# Patient Record
Sex: Female | Born: 1950 | Race: White | Hispanic: No | Marital: Married | Smoking: Never smoker
Health system: Southern US, Community
[De-identification: ages and names within clinical notes are randomized; demographics above are authoritative.]

## PROBLEM LIST (undated history)

## (undated) DIAGNOSIS — R011 Cardiac murmur, unspecified: Secondary | ICD-10-CM

## (undated) DIAGNOSIS — M810 Age-related osteoporosis without current pathological fracture: Secondary | ICD-10-CM

## (undated) DIAGNOSIS — Z9089 Acquired absence of other organs: Secondary | ICD-10-CM

## (undated) HISTORY — DX: Cardiac murmur, unspecified: R01.1

## (undated) HISTORY — PX: ECTOPIC PREGNANCY SURGERY: SHX613

## (undated) HISTORY — DX: Age-related osteoporosis without current pathological fracture: M81.0

## (undated) HISTORY — PX: TONSILLECTOMY: SUR1361

## (undated) HISTORY — DX: Acquired absence of other organs: Z90.89

---

## 1982-06-01 DIAGNOSIS — O00109 Unspecified tubal pregnancy without intrauterine pregnancy: Secondary | ICD-10-CM

## 1982-06-01 DIAGNOSIS — Z98891 History of uterine scar from previous surgery: Secondary | ICD-10-CM

## 1982-06-01 HISTORY — DX: History of uterine scar from previous surgery: Z98.891

## 1982-06-01 HISTORY — DX: Unspecified tubal pregnancy without intrauterine pregnancy: O00.109

## 1989-06-01 DIAGNOSIS — Z98891 History of uterine scar from previous surgery: Secondary | ICD-10-CM

## 1989-06-01 HISTORY — DX: History of uterine scar from previous surgery: Z98.891

## 1997-06-01 DIAGNOSIS — Z905 Acquired absence of kidney: Secondary | ICD-10-CM

## 1997-06-01 HISTORY — DX: Acquired absence of kidney: Z90.5

## 1999-06-02 HISTORY — PX: KIDNEY DONATION: SHX685

## 2013-10-27 ENCOUNTER — Emergency Department (HOSPITAL_COMMUNITY): Payer: Self-pay

## 2013-10-27 ENCOUNTER — Emergency Department (HOSPITAL_COMMUNITY)
Admission: EM | Admit: 2013-10-27 | Discharge: 2013-10-27 | Disposition: A | Discharge status: Home / Self Care | Payer: Self-pay | Attending: Emergency Medicine | Admitting: Emergency Medicine

## 2013-10-27 ENCOUNTER — Encounter (HOSPITAL_COMMUNITY): Payer: Self-pay | Admitting: Emergency Medicine

## 2013-10-27 DIAGNOSIS — Z79899 Other long term (current) drug therapy: Secondary | ICD-10-CM | POA: Insufficient documentation

## 2013-10-27 DIAGNOSIS — R0602 Shortness of breath: Secondary | ICD-10-CM | POA: Insufficient documentation

## 2013-10-27 DIAGNOSIS — R072 Precordial pain: Secondary | ICD-10-CM | POA: Insufficient documentation

## 2013-10-27 DIAGNOSIS — R110 Nausea: Secondary | ICD-10-CM | POA: Insufficient documentation

## 2013-10-27 DIAGNOSIS — Z880 Allergy status to penicillin: Secondary | ICD-10-CM | POA: Insufficient documentation

## 2013-10-27 DIAGNOSIS — R61 Generalized hyperhidrosis: Secondary | ICD-10-CM | POA: Insufficient documentation

## 2013-10-27 DIAGNOSIS — R079 Chest pain, unspecified: Secondary | ICD-10-CM

## 2013-10-27 LAB — I-STAT TROPONIN, ED: Troponin i, poc: 0.01 ng/mL (ref 0.00–0.08)

## 2013-10-27 LAB — BASIC METABOLIC PANEL
BUN: 22 mg/dL (ref 6–23)
CALCIUM: 8.8 mg/dL (ref 8.4–10.5)
CO2: 23 mEq/L (ref 19–32)
CREATININE: 1.03 mg/dL (ref 0.50–1.10)
Chloride: 109 mEq/L (ref 96–112)
GFR, EST AFRICAN AMERICAN: 67 mL/min — AB (ref >90)
GFR, EST NON AFRICAN AMERICAN: 57 mL/min — AB (ref >90)
Glucose, Bld: 107 mg/dL — ABNORMAL HIGH (ref 70–99)
Potassium: 3.7 mEq/L (ref 3.7–5.3)
Sodium: 144 mEq/L (ref 137–147)

## 2013-10-27 LAB — CBC
HCT: 36.2 % (ref 36.0–46.0)
Hemoglobin: 12.3 g/dL (ref 12.0–15.0)
MCH: 31.3 pg (ref 26.0–34.0)
MCHC: 34 g/dL (ref 30.0–36.0)
MCV: 92.1 fL (ref 78.0–100.0)
PLATELETS: 215 10*3/uL (ref 150–400)
RBC: 3.93 MIL/uL (ref 3.87–5.11)
RDW: 13.1 % (ref 11.5–15.5)
WBC: 6.8 10*3/uL (ref 4.0–10.5)

## 2013-10-27 LAB — TROPONIN I: Troponin I: <0.3 ng/mL (ref <0.30)

## 2013-10-27 MED ORDER — ASPIRIN EC 325 MG PO TBEC: 325.0000 mg | DELAYED_RELEASE_TABLET | Freq: Every day | ORAL | Status: DC

## 2013-10-27 NOTE — Discharge Instructions (Signed)
We saw you in the ER for the chest pain/shortness of breath. °All of our cardiac workup is normal, including labs, EKG and chest X-RAY are normal. °We are not sure what is causing your discomfort, but we feel comfortable sending you home at this time. The workup in the ER is not complete, and you should follow up with your primary care doctor for further evaluation. ° ° °Chest Pain (Nonspecific) °It is often hard to give a specific diagnosis for the cause of chest pain. There is always a chance that your pain could be related to something serious, such as a heart attack or a blood clot in the lungs. You need to follow up with your caregiver for further evaluation. °CAUSES  °· Heartburn. °· Pneumonia or bronchitis. °· Anxiety or stress. °· Inflammation around your heart (pericarditis) or lung (pleuritis or pleurisy). °· A blood clot in the lung. °· A collapsed lung (pneumothorax). It can develop suddenly on its own (spontaneous pneumothorax) or from injury (trauma) to the chest. °· Shingles infection (herpes zoster virus). °The chest wall is composed of bones, muscles, and cartilage. Any of these can be the source of the pain. °· The bones can be bruised by injury. °· The muscles or cartilage can be strained by coughing or overwork. °· The cartilage can be affected by inflammation and become sore (costochondritis). °DIAGNOSIS  °Lab tests or other studies, such as X-rays, electrocardiography, stress testing, or cardiac imaging, may be needed to find the cause of your pain.  °TREATMENT  °· Treatment depends on what may be causing your chest pain. Treatment may include: °· Acid blockers for heartburn. °· Anti-inflammatory medicine. °· Pain medicine for inflammatory conditions. °· Antibiotics if an infection is present. °· You may be advised to change lifestyle habits. This includes stopping smoking and avoiding alcohol, caffeine, and chocolate. °· You may be advised to keep your head raised (elevated) when sleeping.  This reduces the chance of acid going backward from your stomach into your esophagus. °· Most of the time, nonspecific chest pain will improve within 2 to 3 days with rest and mild pain medicine. °HOME CARE INSTRUCTIONS  °· If antibiotics were prescribed, take your antibiotics as directed. Finish them even if you start to feel better. °· For the next few days, avoid physical activities that bring on chest pain. Continue physical activities as directed. °· Do not smoke. °· Avoid drinking alcohol. °· Only take over-the-counter or prescription medicine for pain, discomfort, or fever as directed by your caregiver. °· Follow your caregiver's suggestions for further testing if your chest pain does not go away. °· Keep any follow-up appointments you made. If you do not go to an appointment, you could develop lasting (chronic) problems with pain. If there is any problem keeping an appointment, you must call to reschedule. °SEEK MEDICAL CARE IF:  °· You think you are having problems from the medicine you are taking. Read your medicine instructions carefully. °· Your chest pain does not go away, even after treatment. °· You develop a rash with blisters on your chest. °SEEK IMMEDIATE MEDICAL CARE IF:  °· You have increased chest pain or pain that spreads to your arm, neck, jaw, back, or abdomen. °· You develop shortness of breath, an increasing cough, or you are coughing up blood. °· You have severe back or abdominal pain, feel nauseous, or vomit. °· You develop severe weakness, fainting, or chills. °· You have a fever. °THIS IS AN EMERGENCY. Do not wait to   see if the pain will go away. Get medical help at once. Call your local emergency services (911 in [place] Do not drive yourself to the hospital. MAKE SURE YOU:   Understand these instructions.  Will watch your condition.  Will get help right away if you are not doing well or get worse. Document Released: 02/25/2005 Document Revised: 08/10/2011 Document Reviewed:  12/22/2007 [facility] Patient Information 2014 [place].  Aspirin and Your Heart Aspirin affects the way your blood clots and helps "thin" the blood. Aspirin has many uses in heart disease. It may be used as a primary prevention to help reduce the risk of heart related events. It also can be used as a secondary measure to prevent more heart attacks or to prevent additional damage from blood clots.  ASPIRIN MAY HELP IF YOU:  Have had a heart attack or chest pain.  Have undergone open heart surgery such as CABG (Coronary Artery Bypass Surgery).  Have had coronary angioplasty with or without stents.  Have experienced a stroke or TIA (transient ischemic attack).  Have peripheral vascular disease (PAD).  Have chronic heart rhythm problems such as atrial fibrillation.  Are at risk for heart disease. BEFORE STARTING ASPIRIN Before you start taking aspirin, your caregiver will need to review your medical history. Many things will need to be taken into consideration, such as:  Smoking status.  Blood pressure.  Diabetes.  Gender.  Weight.  Cholesterol level. ASPIRIN DOSES  Aspirin should only be taken on the advice of your caregiver. Talk to your caregiver about how much aspirin you should take. Aspirin comes in different doses such as:  81 mg.  162 mg.  325 mg.  The aspirin dose you take may be affected by many factors, some of which include:  Your current medications, especially if your are taking blood-thinners or anti-platelet medicine.  Liver function.  Heart disease risk.  Age.  Aspirin comes in two forms:  Non-enteric-coated. This type of aspirin does not have a coating and is absorbed faster. Non-enteric coated aspirin is recommended for patients experiencing chest pain symptoms. This type of aspirin also comes in a chewable form.  Enteric-coated. This means the aspirin has a special coating that releases the medicine very slowly. Enteric-coated aspirin  causes less stomach upset. This type of aspirin should not be chewed or crushed. ASPIRIN SIDE EFFECTS Daily use of aspirin can increase your risk of serious side effects. Some of these include:  Increased bleeding. This can range from a cut that does not stop bleeding to more serious problems such as stomach bleeding or bleeding into the brain (Intracerebral bleeding).  Increased bruising.  Stomach upset.  An allergic reaction such as red, itchy skin.  Increased risk of bleeding when combined with non-steroidal anti-inflammatory medicine (NSAIDS).  Alcohol should be drank in moderation when taking aspirin. Alcohol can increase the risk of stomach bleeding when taken with aspirin.  Aspirin should not be given to children less than 95 years of age due to the association of Reye syndrome. Reye syndrome is a serious illness that can affect the brain and liver. Studies have linked Reye syndrome with aspirin use in children.  People that have nasal polyps have an increased risk of developing an aspirin allergy. SEEK MEDICAL CARE IF:   You develop an allergic reaction such as:  Hives.  Itchy skin.  Swelling of the lips, tongue or face.  You develop stomach pain.  You have unusual bleeding or bruising.  You have ringing in  your ears. SEEK IMMEDIATE MEDICAL CARE IF:   You have severe chest pain, especially if the pain is crushing or pressure-like and spreads to the arms, back, neck, or jaw. THIS IS AN EMERGENCY. Do not wait to see if the pain will go away. Get medical help at once. Call your local emergency services (911 in the [place] DO NOT drive yourself to the hospital.  You have stroke-like symptoms such as:  Loss of vision.  Difficulty talking.  Numbness or weakness on one side of your body.  Numbness or weakness in your arm or leg.  Not thinking clearly or feeling confused.  Your bowel movements are bloody, dark red or black in color.  You vomit or cough up  blood.  You have blood in your urine.  You have shortness of breath, coughing or wheezing. MAKE SURE YOU:   Understand these instructions.  Will monitor your condition.  Seek immediate medical care if necessary. Document Released: 04/30/2008 Document Revised: 09/12/2012 Document Reviewed: 04/30/2008 [facility] Patient Information 2014 [place].

## 2013-10-27 NOTE — ED Notes (Signed)
Pt states that she started having substernal chest pain radiating to her left arm, with SOB and diaphoresis onset 2330. Pt states she took 324 ASA at home. Pt received 2 NTG en route with complete relief of chest pain.

## 2013-10-27 NOTE — ED Provider Notes (Addendum)
CSN: [id number]     Arrival date & time 10/27/13  0127 History   First MD Initiated Contact with Patient 10/27/13 0344     Chief Complaint  Patient presents with  . Chest Pain     (Consider location/radiation/quality/duration/timing/severity/associated sxs/prior Treatment) HPI Comments: Pt is a 63 y/o female who comes in to the ER with cc of chest pain. Pt has no medical hx, + fam hx of CAD - brother in his 52s. Pt states that earlier in the night she started having left arm pain, with tingling. Overtime, she started having chest pain. Chest pain was mid sternal and pressure like. She also had associated nausea, dib, and diaphoresis. Pt got nitro x 1 - and she felt better, and after nitro x 2 - she has been pain free. No hx of chest pain, no hx of PE, DVT and no risk factors for the same. Pt also denies any GERD hx., PUD hx.   Patient is a 63 y.o. female presenting with chest pain. The history is provided by the patient.  Chest Pain Associated symptoms: diaphoresis, nausea and shortness of breath   Associated symptoms: no abdominal pain, no headache and not vomiting     History reviewed. No pertinent past medical history. Past Surgical History  Procedure Laterality Date  . Tonsillectomy    . Kidney donation Left 2001  . Cesarean section      X 2  . Ectopic pregnancy surgery     History reviewed. No pertinent family history. History  Substance Use Topics  . Smoking status: Never Smoker   . Smokeless tobacco: Not on file  . Alcohol Use: No   OB History   Grav Para Term Preterm Abortions TAB SAB Ect Mult Living                 Review of Systems  Constitutional: Positive for diaphoresis and activity change.  Respiratory: Positive for shortness of breath.   Cardiovascular: Positive for chest pain.  Gastrointestinal: Positive for nausea. Negative for vomiting and abdominal pain.  Genitourinary: Negative for dysuria.  Musculoskeletal: Negative for neck pain.  Neurological:  Negative for headaches.  All other systems reviewed and are negative.     Allergies  Penicillins  Home Medications   Prior to Admission medications   Medication Sig Start Date End Date Taking? Authorizing Provider  CALCIUM PO Take 1 tablet by mouth daily.   Yes Historical Provider, MD  Multiple Vitamin (MULTI-VITAMIN PO) Take 1 tablet by mouth daily.   Yes Historical Provider, MD   BP 122/68  Pulse 71  Temp(Src) 98.2 F (36.8 C) (Oral)  Resp 13  Ht 5\' 5"  (1.651 m)  Wt 150 lb (68.04 kg)  BMI 24.96 kg/m2  SpO2 100% Physical Exam  Nursing note and vitals reviewed. Constitutional: She is oriented to person, place, and time. She appears well-developed and well-nourished.  HENT:  Head: Normocephalic and atraumatic.  Eyes: EOM are normal. Pupils are equal, round, and reactive to light.  Neck: Neck supple.  Cardiovascular: Normal rate, regular rhythm, normal heart sounds and intact distal pulses.   No murmur heard. Pulmonary/Chest: Effort normal. No respiratory distress.  Abdominal: Soft. She exhibits no distension. There is no tenderness. There is no rebound and no guarding.  Neurological: She is alert and oriented to person, place, and time.  Skin: Skin is warm and dry.    ED Course  Procedures (including critical care time) Labs Review Labs Reviewed  BASIC METABOLIC PANEL - Abnormal;  Notable for the following:    Glucose, Bld 107 (*)    GFR calc non Af Amer 57 (*)    GFR calc Af Amer 67 (*)    All other components within normal limits  CBC  I-STAT TROPOININ, ED    Imaging Review No results found.   Date: 10/27/2013  Rate: 75  Rhythm: normal sinus rhythm  QRS Axis: normal  Intervals: normal  ST/T Wave abnormalities: normal  Conduction Disutrbances: none  Narrative Interpretation: unremarkable       EKG Interpretation   Date/Time:  Friday Oct 27 2013 04:23:13 EDT Ventricular Rate:  64 PR Interval:  119 QRS Duration: 90 QT Interval:  444 QTC  Calculation: 458 R Axis:   73 Text Interpretation:  Sinus rhythm Borderline short PR interval  Nonspecific T abnrm, anterolateral leads Confirmed by Kathrynn Humble, MD, Antara Brecheisen  ([id number]) on 10/27/2013 5:36:57 AM      MDM   Final diagnoses:  None    PT comes in with cc of chest pain. She has a HEART Score of 3 - 2 for the suspicious hx and 1 for age. PT also has fam hx of CAD at young age.  Pt's initial troponin and EKG look reassuring. She is from [place] and visiting this area. Will need admission for r/o from my perspective - patient is currently hesitant, so we will reassess and decide.   Varney Biles, MD 10/27/13 0418  5:37 AM Pt wants to go home if troponin x 2 is neg. She is a Software engineer. I discussed with her that her HEART score and risk factors are low - but her pain was concerning. If troponin x 2 are neg, that just means there was no heart attack - and that she needs to be urgently seen by her pcp. She reports that she would come back to the Er if the symptoms get worse. She will see her pcp asap.  Varney Biles, MD 10/27/13 [phone]

## 2015-01-03 IMAGING — CR DG CHEST 1V PORT
1 series · 1 of 1 positions shown · non-contrast
Comparison: None.

CLINICAL DATA: Chest pain.  Hypertension.

EXAM:
PORTABLE CHEST - 1 VIEW

[AP]
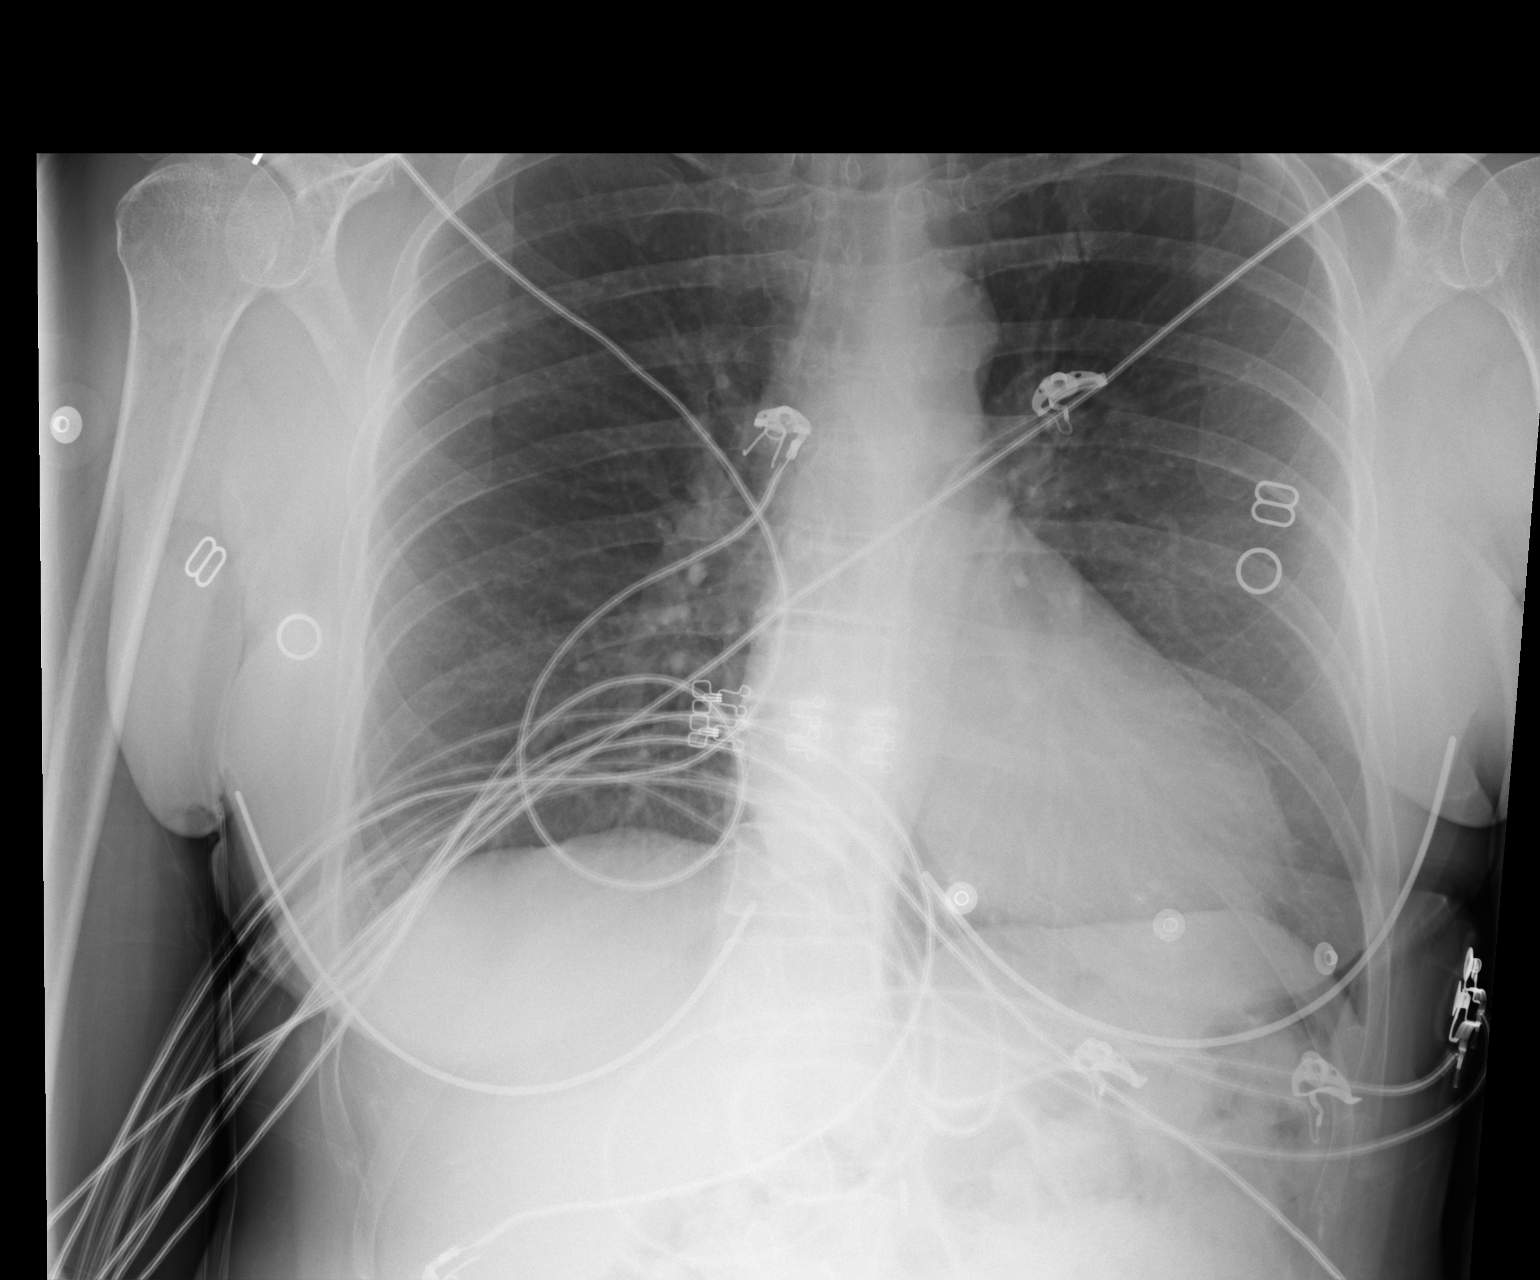

[1 of 1 positions shown; findings below may reference images not displayed]

FINDINGS: The lungs are well-aerated and clear. There is no evidence of focal
opacification, pleural effusion or pneumothorax.

The cardiomediastinal silhouette is within normal limits. No acute
osseous abnormalities are seen.
IMPRESSION: No acute cardiopulmonary process seen.

## 2019-06-23 ENCOUNTER — Ambulatory Visit: Payer: Medicare Other | Attending: Internal Medicine

## 2019-06-23 DIAGNOSIS — Z23 Encounter for immunization: Secondary | ICD-10-CM

## 2019-06-23 NOTE — Progress Notes (Signed)
   Covid-19 Vaccination Clinic  Name:  Michelle Villegas    MRN: [id number] DOB: [birth year]  06/23/2019  [name] was observed post Covid-19 immunization for 15 minutes without incidence. She was provided with Vaccine Information Sheet and instruction to access the V-Safe system.   [name] was instructed to call 911 with any severe reactions post vaccine: Marland Kitchen Difficulty breathing  . Swelling of your face and throat  . A fast heartbeat  . A bad rash all over your body  . Dizziness and weakness    Immunizations Administered    Name Date Dose VIS Date Route   Pfizer COVID-19 Vaccine 06/23/2019 10:20 AM 0.3 mL 05/12/2019 Intramuscular   Manufacturer: [place]   Lot: [id number]   [place]: [id number]

## 2019-07-14 ENCOUNTER — Ambulatory Visit: Payer: Medicare Other | Attending: Internal Medicine

## 2019-07-14 DIAGNOSIS — Z23 Encounter for immunization: Secondary | ICD-10-CM | POA: Insufficient documentation

## 2019-07-14 NOTE — Progress Notes (Signed)
   Covid-19 Vaccination Clinic  Name:  Michelle Villegas    MRN: [id number] DOB: 1951-05-12  07/14/2019  Michelle Villegas was observed post Covid-19 immunization for 15 minutes without incidence. She was provided with Vaccine Information Sheet and instruction to access the V-Safe system.   Michelle Villegas was instructed to call 911 with any severe reactions post vaccine: Marland Kitchen Difficulty breathing  . Swelling of your face and throat  . A fast heartbeat  . A bad rash all over your body  . Dizziness and weakness    Immunizations Administered    Name Date Dose VIS Date Route   Pfizer COVID-19 Vaccine 07/14/2019 11:48 AM 0.3 mL 05/12/2019 Intramuscular   Manufacturer: [place]   Lot: [id number]   [place]: [id number]

## 2021-10-23 ENCOUNTER — Emergency Department (HOSPITAL_COMMUNITY): Payer: Medicare Other

## 2021-10-23 ENCOUNTER — Other Ambulatory Visit: Payer: Self-pay

## 2021-10-23 ENCOUNTER — Emergency Department (HOSPITAL_COMMUNITY)
Admission: EM | Admit: 2021-10-23 | Discharge: 2021-10-23 | Disposition: A | Discharge status: Home / Self Care | Payer: Medicare Other | Attending: Emergency Medicine | Admitting: Emergency Medicine

## 2021-10-23 ENCOUNTER — Encounter (HOSPITAL_COMMUNITY): Payer: Self-pay

## 2021-10-23 DIAGNOSIS — Z20822 Contact with and (suspected) exposure to covid-19: Secondary | ICD-10-CM | POA: Insufficient documentation

## 2021-10-23 DIAGNOSIS — Z7982 Long term (current) use of aspirin: Secondary | ICD-10-CM | POA: Insufficient documentation

## 2021-10-23 DIAGNOSIS — U071 COVID-19: Secondary | ICD-10-CM | POA: Diagnosis not present

## 2021-10-23 DIAGNOSIS — R059 Cough, unspecified: Secondary | ICD-10-CM | POA: Diagnosis present

## 2021-10-23 LAB — RESP PANEL BY RT-PCR (FLU A&B, COVID) ARPGX2
Influenza A by PCR: NEGATIVE
Influenza B by PCR: NEGATIVE
SARS Coronavirus 2 by RT PCR: POSITIVE — AB

## 2021-10-23 MED ORDER — ALBUTEROL SULFATE HFA 108 (90 BASE) MCG/ACT IN AERS: 1.0000 | INHALATION_SPRAY | Freq: Four times a day (QID) | RESPIRATORY_TRACT | 0 refills | Status: DC | PRN

## 2021-10-23 MED ORDER — DOXYCYCLINE HYCLATE 100 MG PO CAPS: 100.0000 mg | ORAL_CAPSULE | Freq: Two times a day (BID) | ORAL | 0 refills | Status: DC

## 2021-10-23 NOTE — Discharge Instructions (Signed)
Albuterol inhaler as needed as prescribed for cough. Doxycycline as prescribed and complete the full course. Recheck with your doctor as needed, return to the ER for severe or concerning symptom.

## 2021-10-23 NOTE — ED Provider Notes (Signed)
[facility] DEPT Provider Note   CSN: [id number] Arrival date & time: 10/23/21  1011     History  Chief Complaint  Patient presents with   Cough    Michelle Villegas is a 72 y.o. female.  71 year old female with no significant past medical history presents with worsening cough 1 week into COVID-like illness with positive home test.  Cough is productive with brown sputum.  Denies fevers, chills, shortness of breath.  Patient is a non-smoker, no history of chronic lung disease.  No other complaints or concerns.      Home Medications Prior to Admission medications   Medication Sig Start Date End Date Taking? Authorizing Provider  albuterol (VENTOLIN HFA) 108 (90 Base) MCG/ACT inhaler Inhale 1-2 puffs into the lungs every 6 (six) hours as needed for wheezing or shortness of breath. 10/23/21  Yes Tacy Learn, PA-C  doxycycline (VIBRAMYCIN) 100 MG capsule Take 1 capsule (100 mg total) by mouth 2 (two) times daily. 10/23/21  Yes Tacy Learn, PA-C  aspirin EC 325 MG tablet Take 1 tablet (325 mg total) by mouth daily. 10/27/13   Varney Biles, MD  CALCIUM PO Take 1 tablet by mouth daily.    [provider]  Multiple Vitamin (MULTI-VITAMIN PO) Take 1 tablet by mouth daily.    [provider]      Allergies    Penicillins    Review of Systems   Review of Systems Negative except as per HPI Physical Exam Updated Vital Signs BP 130/78   Pulse 90   Temp 98.4 F (36.9 C) (Oral)   Resp 20   Ht '5\' 6"'$  (1.676 m)   Wt 65.3 kg   SpO2 95%   BMI 23.24 kg/m  Physical Exam Vitals and nursing note reviewed.  Constitutional:      General: She is not in acute distress.    Appearance: She is well-developed. She is not diaphoretic.  HENT:     Head: Normocephalic and atraumatic.     Mouth/Throat:     Mouth: Mucous membranes are moist.  Eyes:     Conjunctiva/sclera: Conjunctivae normal.  Cardiovascular:     Rate and Rhythm: Normal rate  and regular rhythm.     Heart sounds: Normal heart sounds.  Pulmonary:     Effort: Pulmonary effort is normal.     Comments: Coarse lung sounds on right base Abdominal:     Palpations: Abdomen is soft.     Tenderness: There is no abdominal tenderness.  Musculoskeletal:     Cervical back: Normal range of motion and neck supple.     Right lower leg: No edema.     Left lower leg: No edema.  Skin:    General: Skin is warm and dry.     Findings: No erythema or rash.  Neurological:     Mental Status: She is alert and oriented to person, place, and time.  Psychiatric:        Behavior: Behavior normal.    ED Results / Procedures / Treatments   Labs (all labs ordered are listed, but only abnormal results are displayed) Labs Reviewed  RESP PANEL BY RT-PCR (FLU A&B, COVID) ARPGX2 - Abnormal; Notable for the following components:      Result Value   SARS Coronavirus 2 by RT PCR POSITIVE (*)    All other components within normal limits    EKG None  Radiology DG Chest Port 1 View  Result Date: 10/23/2021 CLINICAL DATA:  One week of productive cough with brown sputum. COVID positive. EXAM: PORTABLE CHEST 1 VIEW COMPARISON:  Radiograph Oct 27, 2013 FINDINGS: The heart size and mediastinal contours are within normal limits. No focal airspace consolidation. No visible pleural effusion or pneumothorax. The visualized skeletal structures are unremarkable. IMPRESSION: No acute cardiopulmonary findings. Electronically Signed   By: Dahlia Bailiff M.D.   On: 10/23/2021 11:13    Procedures Procedures    Medications Ordered in ED Medications - No data to display  ED Course/ Medical Decision Making/ A&P                           Medical Decision Making Amount and/or Complexity of Data Reviewed Radiology: ordered.  Risk Prescription drug management.   This patient presents to the ED for concern of cough and congestion x1 week, COVID-positive at home, now with cough with brown sputum,  this involves an extensive number of treatment options, and is a complaint that carries with it a high risk of complications and morbidity.  The differential diagnosis includes but not limited to COVID, pneumonia, asthma/COPD   Co morbidities that complicate the patient evaluation  Otherwise healthy non-smoking female   Additional history obtained:  External records from outside source obtained and reviewed including no recent relevant records available for review   Lab Tests:  I Ordered, and personally interpreted labs.  The pertinent results include: COVID-positive, flu negative   Imaging Studies ordered:  I ordered imaging studies including chest x-ray I independently visualized and interpreted imaging which showed no acute disease I agree with the radiologist interpretation  Problem List / ED Course / Critical interventions / Medication management  71 year old female with complaint of productive cough after having had COVID for 1 week.  She is afebrile, vitals reassuring with O2 sat 95% on room air.  She is a non-smoker, no chronic lung disease, no history of immunocompromise state.  On exam, she does have coarse lung sounds in the right base.  X-ray is negative for pneumonia, with the findings of right lower lobe coarse lung sounds, will treat with doxycycline.  Recommend recheck with PCP if not improving with return to ER precautions provided.  Also given prescription for albuterol inhaler. I have reviewed the patients home medicines and have made adjustments as needed   Social Determinants of Health:  Has PCP for follow-up if needed   Test / Admission - Considered:  Admission considered however with vital signs stable, not requiring supplemental oxygen and normal chest x-ray, felt safe for discharge at this time.         Final Clinical Impression(s) / ED Diagnoses Final diagnoses:  COVID    Rx / DC Orders ED Discharge Orders          Ordered     doxycycline (VIBRAMYCIN) 100 MG capsule  2 times daily        10/23/21 1247    albuterol (VENTOLIN HFA) 108 (90 Base) MCG/ACT inhaler  Every 6 hours PRN        10/23/21 1247              Tacy Learn, PA-C 10/23/21 1450    Lajean Saver, MD 10/23/21 1642

## 2021-10-23 NOTE — ED Triage Notes (Signed)
Patient c/o a productive cough with brown sputum x 1 week.

## 2021-11-19 ENCOUNTER — Ambulatory Visit (INDEPENDENT_AMBULATORY_CARE_PROVIDER_SITE_OTHER): Payer: Medicare Other | Admitting: Family

## 2021-11-19 ENCOUNTER — Encounter: Payer: Self-pay | Admitting: Family

## 2021-11-19 VITALS — BP 146/86 | HR 88 | Temp 98.3°F | Resp 16 | Ht 66.0 in | Wt 147.0 lb

## 2021-11-19 DIAGNOSIS — Z23 Encounter for immunization: Secondary | ICD-10-CM | POA: Diagnosis not present

## 2021-11-19 DIAGNOSIS — Z1211 Encounter for screening for malignant neoplasm of colon: Secondary | ICD-10-CM

## 2021-11-19 DIAGNOSIS — Z1231 Encounter for screening mammogram for malignant neoplasm of breast: Secondary | ICD-10-CM

## 2021-11-19 DIAGNOSIS — R5383 Other fatigue: Secondary | ICD-10-CM | POA: Diagnosis not present

## 2021-11-19 DIAGNOSIS — F32A Depression, unspecified: Secondary | ICD-10-CM

## 2021-11-19 DIAGNOSIS — E348 Other specified endocrine disorders: Secondary | ICD-10-CM

## 2021-11-19 DIAGNOSIS — Z780 Asymptomatic menopausal state: Secondary | ICD-10-CM

## 2021-11-19 LAB — CBC WITH DIFFERENTIAL/PLATELET
Basophils Absolute: 0 10*3/uL (ref 0.0–0.1)
Basophils Relative: 0.7 % (ref 0.0–3.0)
Eosinophils Absolute: 0.1 10*3/uL (ref 0.0–0.7)
Eosinophils Relative: 1.3 % (ref 0.0–5.0)
HCT: 41.8 % (ref 36.0–46.0)
Hemoglobin: 13.8 g/dL (ref 12.0–15.0)
Lymphocytes Relative: 35.6 % (ref 12.0–46.0)
Lymphs Abs: 1.7 10*3/uL (ref 0.7–4.0)
MCHC: 32.9 g/dL (ref 30.0–36.0)
MCV: 91.9 fl (ref 78.0–100.0)
Monocytes Absolute: 0.3 10*3/uL (ref 0.1–1.0)
Monocytes Relative: 6.6 % (ref 3.0–12.0)
Neutro Abs: 2.7 10*3/uL (ref 1.4–7.7)
Neutrophils Relative %: 55.8 % (ref 43.0–77.0)
Platelets: 282 10*3/uL (ref 150.0–400.0)
RBC: 4.55 Mil/uL (ref 3.87–5.11)
RDW: 14.6 % (ref 11.5–15.5)
WBC: 4.9 10*3/uL (ref 4.0–10.5)

## 2021-11-19 LAB — COMPREHENSIVE METABOLIC PANEL
ALT: 27 U/L (ref 0–35)
AST: 29 U/L (ref 0–37)
Albumin: 4.3 g/dL (ref 3.5–5.2)
Alkaline Phosphatase: 51 U/L (ref 39–117)
BUN: 17 mg/dL (ref 6–23)
CO2: 27 mEq/L (ref 19–32)
Calcium: 10.1 mg/dL (ref 8.4–10.5)
Chloride: 105 mEq/L (ref 96–112)
Creatinine, Ser: 1.03 mg/dL (ref 0.40–1.20)
GFR: 55.38 mL/min — ABNORMAL LOW (ref >60.00)
Glucose, Bld: 86 mg/dL (ref 70–99)
Potassium: 4.6 mEq/L (ref 3.5–5.1)
Sodium: 140 mEq/L (ref 135–145)
Total Bilirubin: 0.7 mg/dL (ref 0.2–1.2)
Total Protein: 7.2 g/dL (ref 6.0–8.3)

## 2021-11-19 LAB — TSH: TSH: 1.77 u[IU]/mL (ref 0.35–5.50)

## 2021-11-19 MED ORDER — FLUOXETINE HCL 20 MG PO TABS: ORAL_TABLET | ORAL | 0 refills | Status: DC

## 2021-11-19 MED ORDER — ALBUTEROL SULFATE HFA 108 (90 BASE) MCG/ACT IN AERS: 1.0000 | INHALATION_SPRAY | Freq: Four times a day (QID) | RESPIRATORY_TRACT | 0 refills | Status: DC | PRN

## 2021-11-19 MED ORDER — SHINGRIX 50 MCG/0.5ML IM SUSR: INTRAMUSCULAR | 1 refills | Status: DC

## 2021-11-19 NOTE — Assessment & Plan Note (Signed)
Uncontrolled. Declines counseling. She is interested in prozac which has been helpful for her in the past. Recommended '20mg'$  tabs 1/2 tab once daily for 1 week, then increase to a full tab once daily on week two.

## 2021-11-19 NOTE — Assessment & Plan Note (Signed)
New. She did have covid 1 month ago, is caring for her elderly mother and is experiencing some depression symptoms.  I suspect her fatigue is likely multifactorial due to the multiple issues.  Will obtain labs as noted for further medical evaluation.

## 2021-11-19 NOTE — Patient Instructions (Signed)
Please begin prozac '20mg'$  tabs. 1/2 tab once daily for for 1 week, then increase to a full tab once daily on week two.

## 2021-11-19 NOTE — Progress Notes (Signed)
Subjective:   By signing my name below, I, Michelle Villegas, attest that this documentation has been prepared under the direction and in the presence of Michelle Villegas, 11/19/2021     Patient ID: Michelle Villegas, female    DOB: 06-21-1950, 71 y.o.   MRN: [id number]  Chief Complaint  Patient presents with   New Patient (Initial Visit)         HPI Patient is in today for establishment of patient care.   Refills: She is requesting a refill of 108 MCG/ACT of Albuterol.  Mood: She complains of an onset of depression. She states that it is rather depressing taking care of her elderly mother. States mother requires around the clock care. She is not interested at this point to send her mother to care for her dementia. It has been putting a toll on her mental health. She is requesting a anti-depressant medication. She was previously on Prozac and states that the medication relieved her symptoms. She also has been feeling low on energy. On 11/19/2021 she stayed in bed for an hour because she did not have the energy to get out of bed. She reports that she took care of her mother 2 years after she begun retirement. She is not interested in counseling.  Thyroid: She is requesting to get her thyroid levels check. She reports that her mother takes thyroid medication. She is also fatigue and has been increasing in weight. She states that she regularly exercises.  Wt Readings from Last 3 Encounters:  11/19/21 147 lb (66.7 kg)  10/23/21 144 lb (65.3 kg)  10/27/13 150 lb (68 kg)   Cough/Fatigue/SOB: She states that she had shortness of breath, fatigue and cough after experiencing Covid. She states that taking 108 MCG/ACT of Albuterol relieved her symptoms.  Family History: She reports that her mother was diagnosed with breast cancer around 46-87 years old. She currently has carcinoma and breast cancer.  Immunizations: She is UTD on her Covid-19 vaccine and tetanus vaccine. She has not received the  Pneumonia booster and is interested in receiving the vaccine. She has not received the first dose of the Shingles vaccine.  Colonoscopy: She reports that is has been over 10 years since her last colonoscopy. She states that while preparing for her colonoscopy, she was vomiting due to the medication.  Mammogram: She reports that she is overdue for a mammogram. Pap Smear: She reports that her last pap smear was before she was 70 years old. She reports of no immediate concerns from her pap smear. She is interested in getting her pap smear for next visit.  Dexa: She reports that she had a bone density exam years ago. There were some bone thinning found in her screening. She is currently taking calcium supplements daily.  Social: She is currently retired. She used to be a Software engineer. She has two sons, one in [place] and another in [place]. She has a three year grandchild who currently lives in [place]. For fun, she enjoys pickleball, working out in [employer], visiting  and going to her beach house. She notes that she has a yorkie at home. Her mother lives with her.   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   H/O: cesarean section 1984   Heart murmur    History of tonsillectomy    Tubal pregnancy 1984    Past Surgical History:  Procedure Laterality Date   CESAREAN SECTION     X 2   ECTOPIC PREGNANCY SURGERY     KIDNEY DONATION Left 2001   TONSILLECTOMY      Family History  Problem Relation Age of  Onset   Hyperlipidemia Mother    Depression Mother    Breast cancer Mother        in her64's   Cancer Mother    Cancer - Cervical Mother        angiosarcoma   COPD Father    Diabetes Brother    Hearing loss Brother    Kidney disease Brother    Diabetes Maternal Grandmother    Arthritis Maternal Grandfather    Diabetes Paternal Grandmother     Social History   Socioeconomic History   Marital status: Married    Spouse name: Not on file   Number of children: Not on file   Years of education: Not on file   Highest education level: Not on file  Occupational History   Not on file  Tobacco Use   Smoking status: Never   Smokeless tobacco: Not on file  Vaping Use   Vaping Use: Never used  Substance and Sexual Activity   Alcohol use: No   Drug use: No   Sexual activity: Yes    Partners: Male  Other Topics Concern   Not on file  Social History Narrative   Retired Software engineer    2 grown sons, one in [place] and one in [employer], granddaughter in [place]   Enjoys Visual merchandiser, gym, visiting family, and beach house   Has a Yorkie   Social Determinants of Health   Financial Resource Strain: Not on file  Food Insecurity: Not on file  Transportation Needs: Not on file  Physical Activity: Sufficiently Active (11/19/2021)   Exercise Vital Sign    Days of Exercise per Week: 4 days    Minutes of Exercise per Session: 90 min  Stress: Not on file  Social Connections: Not on file  Intimate Partner Violence: Not on file    Outpatient Medications Prior to Visit  Medication Sig Dispense Refill   Beta Carotene (VITAMIN A) 25000 UNIT capsule Take 25,000 Units by mouth daily.     Biotin 5000 MCG TABS Take by mouth.     CALCIUM PO Take 1 tablet by mouth daily.     folic acid (FOLVITE) 676 MCG tablet Take by mouth daily.     magnesium gluconate (MAGONATE) 500 MG tablet Take 500 mg by mouth 2 (two) times daily.     Turmeric (QC TUMERIC COMPLEX) 500 MG CAPS Take by mouth.     vitamin C  (ASCORBIC ACID) 500 MG tablet Take 500 mg by mouth daily.     zinc gluconate 50 MG tablet Take 50 mg by mouth daily.     albuterol (VENTOLIN HFA) 108 (90 Base) MCG/ACT inhaler Inhale 1-2 puffs into the lungs every 6 (six) hours as needed for wheezing or shortness of breath. 18 g 0   aspirin EC 325 MG  tablet Take 1 tablet (325 mg total) by mouth daily. 30 tablet 0   doxycycline (VIBRAMYCIN) 100 MG capsule Take 1 capsule (100 mg total) by mouth 2 (two) times daily. 20 capsule 0   Multiple Vitamin (MULTI-VITAMIN PO) Take 1 tablet by mouth daily.     No facility-administered medications prior to visit.    Allergies  Allergen Reactions   Penicillins     Unsure of reaction. Childhood allergy.    Review of Systems  Constitutional:  Positive for malaise/fatigue.  Psychiatric/Behavioral:  Positive for depression.        Objective:    Physical Exam Constitutional:      General: She is not in acute distress.    Appearance: Normal appearance. She is not ill-appearing.  HENT:     Head: Normocephalic and atraumatic.     Right Ear: Tympanic membrane, ear canal and external ear normal.     Left Ear: Tympanic membrane, ear canal and external ear normal.  Eyes:     Extraocular Movements: Extraocular movements intact.     Pupils: Pupils are equal, round, and reactive to light.  Neck:     Thyroid: No thyromegaly.  Cardiovascular:     Rate and Rhythm: Normal rate and regular rhythm.     Heart sounds: Normal heart sounds. No murmur heard. Pulmonary:     Effort: Pulmonary effort is normal. No respiratory distress.     Breath sounds: Normal breath sounds. No wheezing or rales.  Lymphadenopathy:     Cervical: No cervical adenopathy.  Skin:    General: Skin is warm and dry.  Neurological:     Mental Status: She is alert and oriented to person, place, and time.  Psychiatric:        Mood and Affect: Mood normal.        Behavior: Behavior normal.        Judgment: Judgment normal.     BP  (!) 146/86 (BP Location: Right Arm, Patient Position: Sitting, Cuff Size: Small)   Pulse 88   Temp 98.3 F (36.8 C) (Oral)   Resp 16   Ht '5\' 6"'  (1.676 m)   Wt 147 lb (66.7 kg)   SpO2 99%   BMI 23.73 kg/m  Wt Readings from Last 3 Encounters:  11/19/21 147 lb (66.7 kg)  10/23/21 144 lb (65.3 kg)  10/27/13 150 lb (68 kg)       Assessment & Plan:   Problem List Items Addressed This Visit       Unprioritized   Fatigue    New. She did have covid 1 month ago, is caring for her elderly mother and is experiencing some depression symptoms.  I suspect her fatigue is likely multifactorial due to the multiple issues.  Will obtain labs as noted for further medical evaluation.       Relevant Orders   Comp Met (CMET)   CBC with Differential/Platelet   TSH   Depression    Uncontrolled. Declines counseling. She is interested in prozac which has been helpful for her in the past. Recommended 68m tabs 1/2 tab once daily for 1 week, then increase to a full tab once daily on week two.       Relevant Medications   FLUoxetine (PROZAC) 20 MG tablet   Other Visit Diagnoses     Colon cancer screening    -  Primary   Relevant Orders   Ambulatory referral to Gastroenterology   Encounter for screening mammogram for malignant neoplasm of breast  Relevant Orders   MM 3D SCREEN BREAST BILATERAL   Estradiol deficiency       Relevant Orders   DG Bone Density   Menopause       Relevant Orders   DG Bone Density      38 minutes spent on today's visit. The majority of the time was spent interviewing the patient and formulating medical plan.   Meds ordered this encounter  Medications   Zoster Vaccine Adjuvanted [facility]) injection    Sig: 50 mcg IM now and again in 2-6 months    Dispense:  0.5 mL    Refill:  1    Order Specific Question:   Supervising Provider    Answer:   Penni Homans A [[id number]]   FLUoxetine (PROZAC) 20 MG tablet    Sig: 1/2 tab once daily for 1 week, then increase to  a full tab once daily on week two    Dispense:  45 tablet    Refill:  0    Order Specific Question:   Supervising Provider    Answer:   Penni Homans A [[id number]]   albuterol (VENTOLIN HFA) 108 (90 Base) MCG/ACT inhaler    Sig: Inhale 1-2 puffs into the lungs every 6 (six) hours as needed for wheezing or shortness of breath.    Dispense:  18 g    Refill:  0    Order Specific Question:   Supervising Provider    Answer:   Penni Homans A [[id number]]    I, Nance Pear, Villegas, personally preformed the services described in this documentation.  All medical record entries made by the scribe were at my direction and in my presence.  I have reviewed the chart and discharge instructions (if applicable) and agree that the record reflects my personal performance and is accurate and complete. 11/19/2021   I,Amber Collins,acting as a Education administrator for Nance Pear, Villegas.,have documented all relevant documentation on the behalf of Nance Pear, Villegas,as directed by  Nance Pear, Villegas while in the presence of Nance Pear, Villegas.  Nance Pear, Villegas

## 2021-11-20 ENCOUNTER — Encounter: Payer: Self-pay | Admitting: Family

## 2021-11-20 NOTE — Addendum Note (Signed)
Addended by: Jiles Prows on: 11/20/2021 01:24 PM   Modules accepted: Orders

## 2021-11-21 NOTE — Progress Notes (Signed)
Mailed out to patient 

## 2021-12-08 ENCOUNTER — Encounter (HOSPITAL_BASED_OUTPATIENT_CLINIC_OR_DEPARTMENT_OTHER): Payer: Self-pay

## 2021-12-08 ENCOUNTER — Ambulatory Visit (HOSPITAL_BASED_OUTPATIENT_CLINIC_OR_DEPARTMENT_OTHER)
Admission: RE | Admit: 2021-12-08 | Discharge: 2021-12-08 | Disposition: A | Discharge status: Home / Self Care | Payer: Medicare Other | Source: Ambulatory Visit | Attending: Family | Admitting: Family

## 2021-12-08 DIAGNOSIS — E348 Other specified endocrine disorders: Secondary | ICD-10-CM

## 2021-12-08 DIAGNOSIS — Z780 Asymptomatic menopausal state: Secondary | ICD-10-CM | POA: Insufficient documentation

## 2021-12-08 DIAGNOSIS — Z1231 Encounter for screening mammogram for malignant neoplasm of breast: Secondary | ICD-10-CM | POA: Insufficient documentation

## 2021-12-09 ENCOUNTER — Encounter: Payer: Self-pay | Admitting: Family

## 2021-12-09 ENCOUNTER — Other Ambulatory Visit: Payer: Self-pay | Admitting: Family

## 2021-12-09 DIAGNOSIS — M810 Age-related osteoporosis without current pathological fracture: Secondary | ICD-10-CM | POA: Insufficient documentation

## 2021-12-09 NOTE — Telephone Encounter (Signed)
Please advise pt that her bone density shows that she has moderate osteoporosis and increase risk of fracture. I would recommend that she add fosamax '70mg'$  once weekly.    Return for vitamin D level in the lab, dx osteoporosis.   Add caltrate '600mg'$  +D bid.  Be sure to get regular weight bearing exercise.   Orders are pended.

## 2021-12-10 ENCOUNTER — Ambulatory Visit (INDEPENDENT_AMBULATORY_CARE_PROVIDER_SITE_OTHER): Payer: Medicare Other | Admitting: Family

## 2021-12-10 ENCOUNTER — Other Ambulatory Visit (HOSPITAL_COMMUNITY)
Admission: RE | Admit: 2021-12-10 | Discharge: 2021-12-10 | Disposition: A | Discharge status: Home / Self Care | Payer: Medicare Other | Source: Ambulatory Visit | Attending: Family | Admitting: Family

## 2021-12-10 VITALS — BP 135/70 | HR 62 | Resp 18 | Ht 66.0 in | Wt 148.0 lb

## 2021-12-10 DIAGNOSIS — Z124 Encounter for screening for malignant neoplasm of cervix: Secondary | ICD-10-CM

## 2021-12-10 DIAGNOSIS — Z0000 Encounter for general adult medical examination without abnormal findings: Secondary | ICD-10-CM | POA: Insufficient documentation

## 2021-12-10 DIAGNOSIS — M810 Age-related osteoporosis without current pathological fracture: Secondary | ICD-10-CM

## 2021-12-10 DIAGNOSIS — F32A Depression, unspecified: Secondary | ICD-10-CM

## 2021-12-10 DIAGNOSIS — Z01419 Encounter for gynecological examination (general) (routine) without abnormal findings: Secondary | ICD-10-CM

## 2021-12-10 MED ORDER — CALTRATE 600+D PLUS MINERALS 600-800 MG-UNIT PO TABS: 1.0000 | ORAL_TABLET | Freq: Two times a day (BID) | ORAL | Status: AC

## 2021-12-10 MED ORDER — ALENDRONATE SODIUM 70 MG PO TABS: 70.0000 mg | ORAL_TABLET | ORAL | 4 refills | Status: DC

## 2021-12-10 NOTE — Progress Notes (Signed)
Subjective:   By signing my name below, I, [name], attest that this documentation has been prepared under the direction and in the presence of Debbrah Alar, NP 12/10/2021      Patient ID: Michelle Villegas, female    DOB: 03-Oct-1950, 71 y.o.   MRN: [id number]  No chief complaint on file.   HPI Patient is in today for an office visit.  Dexa- Her bone density results are low. She is interested in starting Fosamax.   Prozac- She has seen improvement since she began Prozac 20 mg.   Pap smear- She reports never having abnormal pap smear results. She has not had a recent pap smear.   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   H/O: cesarean section 1984   Heart murmur    History of tonsillectomy    Osteoporosis    Tubal pregnancy 1984    Past Surgical History:  Procedure Laterality Date   CESAREAN SECTION     X 2   ECTOPIC PREGNANCY SURGERY     KIDNEY DONATION Left 2001   TONSILLECTOMY      Family History  Problem Relation Age of Onset   Hyperlipidemia Mother    Depression Mother    Breast cancer Mother        in her39's   Cancer Mother    Cancer - Cervical Mother        angiosarcoma   COPD Father    Diabetes Brother    Hearing loss Brother    Kidney disease Brother    Diabetes Maternal Grandmother    Arthritis Maternal Grandfather    Diabetes Paternal Grandmother     Social History   Socioeconomic History   Marital status: Married    Spouse name: Not on file   Number of children: Not on file   Years of education: Not on file   Highest education level: Not on file  Occupational History   Not on file  Tobacco Use   Smoking status: Never   Smokeless tobacco: Not on file  Vaping Use   Vaping Use: Never used  Substance and Sexual Activity   Alcohol use: No   Drug use: No   Sexual activity: Yes    Partners: Male  Other Topics Concern   Not on file   Social History Narrative   Retired Software engineer    2 grown sons, one in [place] and one in [place], granddaughter in [place]   Enjoys Visual merchandiser, gym, visiting family, and beach house   Has a Yorkie   Social Determinants of Health   Financial Resource Strain: Not on file  Food Insecurity: Not on file  Transportation Needs: Not on file  Physical Activity: Sufficiently Active (11/19/2021)   Exercise Vital Sign    Days of Exercise per Week: 4 days    Minutes of Exercise per Session: 90 min  Stress: Not on file  Social Connections: Not on file  Intimate Partner Violence: Not on file    Outpatient Medications Prior to Visit  Medication Sig Dispense Refill   albuterol (VENTOLIN HFA) 108 (90 Base) MCG/ACT inhaler Inhale 1-2 puffs into the lungs every 6 (six) hours as needed for wheezing or shortness of breath. 18 g 0   Beta Carotene (VITAMIN A) 25000 UNIT capsule Take 25,000 Units by mouth daily.     Biotin 5000 MCG TABS Take by mouth.  FLUoxetine (PROZAC) 20 MG tablet 1/2 tab once daily for 1 week, then increase to a full tab once daily on week two 45 tablet 0   folic acid (FOLVITE) 128 MCG tablet Take by mouth daily.     magnesium gluconate (MAGONATE) 500 MG tablet Take 500 mg by mouth 2 (two) times daily.     Turmeric (QC TUMERIC COMPLEX) 500 MG CAPS Take by mouth.     vitamin C (ASCORBIC ACID) 500 MG tablet Take 500 mg by mouth daily.     zinc gluconate 50 MG tablet Take 50 mg by mouth daily.     Zoster Vaccine Adjuvanted [facility] injection 50 mcg IM now and again in 2-6 months 0.5 mL 1   CALCIUM PO Take 1 tablet by mouth daily.     No facility-administered medications prior to visit.    Allergies  Allergen Reactions   Penicillins     Unsure of reaction. Childhood allergy.    ROS See HPI    Objective:    Physical Exam Constitutional:      General: She is not in acute distress.    Appearance: Normal appearance. She is not ill-appearing.  HENT:     Head:  Normocephalic and atraumatic.     Right Ear: External ear normal.     Left Ear: External ear normal.  Eyes:     Extraocular Movements: Extraocular movements intact.     Pupils: Pupils are equal, round, and reactive to light.  Cardiovascular:     Rate and Rhythm: Normal rate.  Pulmonary:     Effort: Pulmonary effort is normal.  Genitourinary:    Vagina: Normal.  Skin:    General: Skin is warm and dry.  Neurological:     Mental Status: She is alert and oriented to person, place, and time.  Psychiatric:        Mood and Affect: Mood normal.        Behavior: Behavior normal.        Judgment: Judgment normal.   Breasts: Examined lying and sitting.  Right: Without masses, retractions, discharge or axillary adenopathy.  Left: Without masses, retractions, discharge or axillary adenopathy.  Inguinal/mons: Normal without inguinal adenopathy  External genitalia: Normal  BUS/Urethra/Skene's glands: Normal  Bladder: Normal  Vagina: Normal  Cervix: Normal  Uterus: normal in size, shape and contour. Midline and mobile  Adnexa/parametria:  Rt: Without masses or tenderness.  Lt: Without masses or tenderness.  Anus and perineum: Normal   BP 135/70   Pulse 62   Resp 18   Ht '5\' 6"'$  (1.676 m)   Wt 148 lb (67.1 kg)   SpO2 97%   BMI 23.89 kg/m  Wt Readings from Last 3 Encounters:  12/10/21 148 lb (67.1 kg)  11/19/21 147 lb (66.7 kg)  10/23/21 144 lb (65.3 kg)     Assessment & Plan:   Problem List Items Addressed This Visit       Unprioritized   Osteoporosis    New finding per bone density results. She is agreeable to begin fosamax '70mg'$  once weekly and knows to sit upright for 90 minutes after taking. Continue caltrate '600mg'$  + D bid and regular weight bearing exercise such as walking.       Relevant Medications   alendronate (FOSAMAX) 70 MG tablet   Calcium Carbonate-Vit D-Min (CALTRATE 600+D PLUS MINERALS) 600-800 MG-UNIT TABS   Encounter for routine gynecologic examination in  Medicare patient    Normal exam, pap performed today.  Depression    Starting to notice some improvement in mood since she started prozac '20mg'$ . Continue same.      Other Visit Diagnoses     Screening for cervical cancer    -  Primary   Relevant Orders   Cytology - PAP( [place])      Meds ordered this encounter  Medications   alendronate (FOSAMAX) 70 MG tablet    Sig: Take 1 tablet (70 mg total) by mouth every 7 (seven) days. Take with a full glass of water on an empty stomach.    Dispense:  12 tablet    Refill:  4    Order Specific Question:   Supervising Provider    Answer:   Penni Homans A [[id number]]   Calcium Carbonate-Vit D-Min (CALTRATE 600+D PLUS MINERALS) 600-800 MG-UNIT TABS    Sig: Take 1 tablet by mouth 2 (two) times daily.    Order Specific Question:   Supervising Provider    Answer:   Mosie Lukes [[id number]]    I, Nance Pear, NP, personally preformed the services described in this documentation.  All medical record entries made by the scribe were at my direction and in my presence.  I have reviewed the chart and discharge instructions (if applicable) and agree that the record reflects my personal performance and is accurate and complete. 12/10/2021  I,Mohammed Iqbal,acting as a scribe for Nance Pear, NP.,have documented all relevant documentation on the behalf of Nance Pear, NP,as directed by  Nance Pear, NP while in the presence of Nance Pear, NP.  Nance Pear, NP

## 2021-12-10 NOTE — Assessment & Plan Note (Signed)
Starting to notice some improvement in mood since she started prozac '20mg'$ . Continue same.

## 2021-12-10 NOTE — Assessment & Plan Note (Addendum)
New finding per bone density results. She is agreeable to begin fosamax '70mg'$  once weekly and knows to sit upright for 90 minutes after taking. Continue caltrate '600mg'$  + D bid and regular weight bearing exercise such as walking.

## 2021-12-10 NOTE — Patient Instructions (Signed)
Please continue prozac.

## 2021-12-10 NOTE — Telephone Encounter (Signed)
Reviewed plan with pt today at her visit.

## 2021-12-10 NOTE — Assessment & Plan Note (Signed)
Normal exam, pap performed today.

## 2021-12-12 LAB — CYTOLOGY - PAP: Diagnosis: NEGATIVE

## 2021-12-15 ENCOUNTER — Encounter: Payer: Self-pay | Admitting: Family

## 2021-12-16 NOTE — Progress Notes (Signed)
Mailed out to pt 

## 2021-12-30 ENCOUNTER — Other Ambulatory Visit: Payer: Self-pay | Admitting: Family

## 2021-12-30 MED ORDER — FLUOXETINE HCL 20 MG PO TABS: 20.0000 mg | ORAL_TABLET | Freq: Every day | ORAL | 0 refills | Status: DC

## 2022-02-09 ENCOUNTER — Telehealth: Payer: Self-pay | Admitting: Family

## 2022-02-09 NOTE — Telephone Encounter (Signed)
Left message for patient to call back and schedule Medicare Annual Wellness Visit (AWV).   Please offer to do virtually or by telephone.  Left office number and my jabber [phone].  AWVI eligible as of 01/30/2018  Please schedule at anytime with Nurse Health Advisor.

## 2022-02-14 ENCOUNTER — Other Ambulatory Visit: Payer: Self-pay | Admitting: Family

## 2022-02-27 ENCOUNTER — Ambulatory Visit (AMBULATORY_SURGERY_CENTER): Payer: Self-pay

## 2022-02-27 VITALS — Ht 66.0 in | Wt 149.0 lb

## 2022-02-27 DIAGNOSIS — Z1211 Encounter for screening for malignant neoplasm of colon: Secondary | ICD-10-CM

## 2022-02-27 MED ORDER — PEG 3350-KCL-NA BICARB-NACL 420 G PO SOLR: 4000.0000 mL | Freq: Once | ORAL | 0 refills | Status: AC

## 2022-02-27 NOTE — Progress Notes (Signed)
No egg or soy allergy known to patient  No issues known to pt with past sedation with any surgeries or procedures Patient denies ever being told they had issues or difficulty with intubation  No FH of Malignant Hyperthermia Pt is not on diet pills Pt is not on  home 02  Pt is not on blood thinners  Pt denies issues with constipation  No A fib or A flutter Have any cardiac testing pending--no Pt instructed to use Singlecare.com or GoodRx for a price reduction on prep   

## 2022-03-13 ENCOUNTER — Ambulatory Visit (INDEPENDENT_AMBULATORY_CARE_PROVIDER_SITE_OTHER): Payer: Medicare Other | Admitting: Family

## 2022-03-13 ENCOUNTER — Ambulatory Visit: Payer: Medicare Other | Admitting: Family

## 2022-03-13 VITALS — BP 128/81 | HR 72 | Temp 98.2°F | Resp 16 | Wt 148.0 lb

## 2022-03-13 DIAGNOSIS — Z0000 Encounter for general adult medical examination without abnormal findings: Secondary | ICD-10-CM

## 2022-03-13 DIAGNOSIS — F32A Depression, unspecified: Secondary | ICD-10-CM

## 2022-03-13 MED ORDER — FLUOXETINE HCL 40 MG PO CAPS: 40.0000 mg | ORAL_CAPSULE | Freq: Every day | ORAL | 1 refills | Status: DC

## 2022-03-13 NOTE — Assessment & Plan Note (Signed)
Uncontrolled. Will increase her prozac from '20mg'$  to '40mg'$ . Recommended follow up in 6 weeks.

## 2022-03-13 NOTE — Progress Notes (Signed)
Subjective:     Patient ID: Michelle Villegas, female    DOB: 1950-12-24, 71 y.o.   MRN: [id number]  Chief Complaint  Patient presents with   Depression    Here for follow up    Depression        Patient is in today for follow up. Since last visit she has placed her mother in a memory care unit.  She feels a lot of guilt about this and questions weather she made the right decision. She and her husband could no longer care for her at home as they could not lift her.  She feels that her depression symptoms have worsened as a result.  She is interested in increasing her dose of prozac.   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   H/O: cesarean section 1984   Heart murmur    History of tonsillectomy    Osteoporosis    Tubal pregnancy 1984    Past Surgical History:  Procedure Laterality Date   CESAREAN SECTION     X 2   ECTOPIC PREGNANCY SURGERY     KIDNEY DONATION Left 2001   TONSILLECTOMY      Family History  Problem Relation Age of Onset   Hyperlipidemia Mother    Depression Mother    Breast cancer Mother        in hher79's  Cancer Mother    Cancer - Cervical Mother        angiosarcoma   COPD Father    Diabetes Brother    Hearing loss Brother    Kidney disease Brother    Diabetes Maternal Grandmother    Arthritis Maternal Grandfather    Diabetes Paternal Grandmother    Colon cancer Neg Hx    Colon polyps Neg Hx    Esophageal cancer Neg Hx    Rectal cancer Neg Hx    Stomach cancer Neg Hx     Social History   Socioeconomic History   Marital status: Married    Spouse name:  Not on file   Number of children: Not on file   Years of education: Not on file   Highest education level: Not on file  Occupational History   Not on file  Tobacco Use   Smoking status: Never   Smokeless tobacco: Not on file  Vaping Use   Vaping Use: Never used  Substance and Sexual Activity   Alcohol use: No   Drug use: No   Sexual activity: Yes    Partners: Male  Other Topics Concern   Not on file  Social History Narrative   Retired pSoftware engineer   2 grown sons, [place]  Enjoys pVisual merchandiser gym, visiting family, and beach house   Has a Yorkie   Social Determinants of Health   Financial Resource Strain: Not on file  Food Insecurity: Not on file  Transportation Needs: Not on file  Physical Activity: Sufficiently Active (11/19/2021)   Exercise Vital Sign    Days of Exercise  per Week: 4 days    Minutes of Exercise per Session: 90 min  Stress: Not on file  Social Connections: Not on file  Intimate Partner Violence: Not on file    Outpatient Medications Prior to Visit  Medication Sig Dispense Refill   albuterol (VENTOLIN HFA) 108 (90 Base) MCG/ACT inhaler Inhale 1-2 puffs into the lungs every 6 (six) hours as needed for wheezing or shortness of breath. 18 g 0   alendronate (FOSAMAX) 70 MG tablet Take 1 tablet (70 mg total) by mouth every 7 (seven) days. Take with a full glass of water on an empty stomach. 12 tablet 4   Beta Carotene (VITAMIN A) 25000 UNIT capsule Take 25,000 Units by mouth daily.     Biotin 5000 MCG TABS Take by mouth.     Calcium Carbonate-Vit D-Min (CALTRATE 600+D PLUS MINERALS) 600-800 MG-UNIT TABS Take 1 tablet by mouth 2 (two) times daily.     folic acid (FOLVITE) 854 MCG tablet Take by mouth daily.     magnesium gluconate (MAGONATE) 500 MG tablet Take 500 mg by mouth 2 (two) times daily.     Turmeric (QC TUMERIC COMPLEX) 500 MG CAPS Take by mouth.     vitamin C (ASCORBIC ACID) 500 MG tablet Take 500 mg  by mouth daily.     zinc gluconate 50 MG tablet Take 50 mg by mouth daily.     Zoster Vaccine Adjuvanted [facility]) injection 50 mcg IM now and again in 2-6 months 0.5 mL 1   FLUoxetine (PROZAC) 20 MG tablet Take 1 tablet (20 mg total) by mouth daily. 90 tablet 1   No facility-administered medications prior to visit.    Allergies  Allergen Reactions   Penicillins     Unsure of reaction. Childhood allergy.    Review of Systems  Psychiatric/Behavioral:  Positive for depression.    See HPI    Objective:    Physical Exam Constitutional:      General: She is not in acute distress.    Appearance: Normal appearance. She is well-developed.  HENT:     Head: Normocephalic and atraumatic.     Right Ear: External ear normal.     Left Ear: External ear normal.  Eyes:     General: No scleral icterus. Neck:     Thyroid: No thyromegaly.  Cardiovascular:     Rate and Rhythm: Normal rate and regular rhythm.     Heart sounds: Normal heart sounds. No murmur heard. Pulmonary:     Effort: Pulmonary effort is normal. No respiratory distress.     Breath sounds: Normal breath sounds. No wheezing.  Musculoskeletal:        General: No swelling.     Cervical back: Neck supple.  Skin:    General: Skin is warm and dry.  Neurological:     Mental Status: She is alert and oriented to person, place, and time.  Psychiatric:        Behavior: Behavior normal.        Thought Content: Thought content normal.        Judgment: Judgment normal.     Comments: Slightly flattened affect     BP 128/81 (BP Location: Right Arm, Patient Position: Sitting, Cuff Size: Small)   Pulse 72   Temp 98.2 F (36.8 C) (Oral)   Resp 16   Wt 148 lb (67.1 kg)   SpO2 97%   BMI 23.89 kg/m  Wt Readings from Last 3 Encounters:  03/13/22 148 lb (67.1  kg)  02/27/22 149 lb (67.6 kg)  12/10/21 148 lb (67.1 kg)       Assessment & Plan:   Problem List Items Addressed This Visit       Unprioritized   Depression     Uncontrolled. Will increase her prozac from '20mg'$  to '40mg'$ . Recommended follow up in 6 weeks.       Relevant Medications   FLUoxetine (PROZAC) 40 MG capsule     I have discontinued Michelle Villegas's FLUoxetine. I am also having her start on FLUoxetine. Additionally, I am having her maintain her folic acid, vitamin A, zinc gluconate, Biotin, magnesium gluconate, ascorbic acid, Turmeric, Shingrix, albuterol, alendronate, and Caltrate 600+D Plus Minerals.  Meds ordered this encounter  Medications   FLUoxetine (PROZAC) 40 MG capsule    Sig: Take 1 capsule (40 mg total) by mouth daily.    Dispense:  90 capsule    Refill:  1    Order Specific Question:   Supervising Provider    Answer:   Penni Homans A [[id number]]

## 2022-03-13 NOTE — Assessment & Plan Note (Signed)
She plans to get her covid vaccine, shingrix, RSV and flu shot at her pharmacy.  Colonoscopy is scheduled for next week.

## 2022-03-17 ENCOUNTER — Encounter: Payer: Self-pay | Admitting: Gastroenterology

## 2022-03-17 ENCOUNTER — Ambulatory Visit (AMBULATORY_SURGERY_CENTER): Payer: Medicare Other | Admitting: Gastroenterology

## 2022-03-17 ENCOUNTER — Other Ambulatory Visit: Payer: Medicare Other

## 2022-03-17 VITALS — BP 112/67 | HR 70 | Temp 97.1°F | Resp 13 | Ht 66.0 in | Wt 149.0 lb

## 2022-03-17 DIAGNOSIS — K5730 Diverticulosis of large intestine without perforation or abscess without bleeding: Secondary | ICD-10-CM

## 2022-03-17 DIAGNOSIS — D120 Benign neoplasm of cecum: Secondary | ICD-10-CM

## 2022-03-17 DIAGNOSIS — Z1211 Encounter for screening for malignant neoplasm of colon: Secondary | ICD-10-CM

## 2022-03-17 DIAGNOSIS — K635 Polyp of colon: Secondary | ICD-10-CM | POA: Diagnosis not present

## 2022-03-17 MED ORDER — SODIUM CHLORIDE 0.9 % IV SOLN: 500.0000 mL | Freq: Once | INTRAVENOUS | Status: DC

## 2022-03-17 NOTE — Op Note (Signed)
[facility] Patient Name: Michelle Villegas Procedure Date: 03/17/2022 10:31 AM MRN: [id number] Endoscopist: Gerrit Heck , MD Age: 71 Referring MD:  Date of Birth: [birth year] Gender: Female Account #: [id number] Procedure:                Colonoscopy Indications:              Screening for colorectal malignant neoplasm (last                            colonoscopy was more than 10 years ago) Medicines:                Monitored Anesthesia Care Procedure:                Pre-Anesthesia Assessment:                           - Prior to the procedure, a History and Physical                            was performed, and patient medications and                            allergies were reviewed. The patient's tolerance of                            previous anesthesia was also reviewed. The risks                            and benefits of the procedure and the sedation                            options and risks were discussed with the patient.                            All questions were answered, and informed consent                            was obtained. Prior Anticoagulants: The patient has                            taken no previous anticoagulant or antiplatelet                            agents. ASA Grade Assessment: II - A patient with                            mild systemic disease. After reviewing the risks                            and benefits, the patient was deemed in                            satisfactory condition to undergo the procedure.  After obtaining informed consent, the colonoscope                            was passed under direct vision. Throughout the                            procedure, the patient's blood pressure, pulse, and                            oxygen saturations were monitored continuously. The                            CF [id number] #[id number] was introduced through the anus                            and advanced  to the the cecum, identified by                            appendiceal orifice and ileocecal valve. The                            colonoscopy was performed without difficulty. The                            patient tolerated the procedure well. The quality                            of the bowel preparation was good. The ileocecal                            valve, appendiceal orifice, and rectum were                            photographed. Scope In: 10:43:36 AM Scope Out: 10:59:54 AM Scope Withdrawal Time: 0 hours 10 minutes 59 seconds  Total Procedure Duration: 0 hours 16 minutes 18 seconds  Findings:                 The perianal and digital rectal examinations were                            normal.                           A 4 mm polyp was found in the cecum. The polyp was                            sessile. The polyp was removed with a cold snare.                            Resection and retrieval were complete. Estimated                            blood loss was minimal.  Multiple small and large-mouthed diverticula were                            found in the sigmoid colon. The remainder of the                            colon was normal appearing.                           The retroflexed view of the distal rectum and anal                            verge was normal and showed no anal or rectal                            abnormalities. Complications:            No immediate complications. Estimated Blood Loss:     Estimated blood loss was minimal. Impression:               - One 4 mm polyp in the cecum, removed with a cold                            snare. Resected and retrieved.                           - Diverticulosis in the sigmoid colon.                           - The distal rectum and anal verge are normal on                            retroflexion view. Recommendation:           - Patient has a contact number available for                             emergencies. The signs and symptoms of potential                            delayed complications were discussed with the                            patient. Return to normal activities tomorrow.                            Written discharge instructions were provided to the                            patient.                           - Resume previous diet.                           - Continue present medications.                           -  Await pathology results.                           - Repeat colonoscopy for surveillance based on                            pathology results. If adenomatous, can repeat in 7                            years. If the polyp is benign or hyperplastic, no                            repeat needed for screening purposes given age >33.                           - Return to GI office PRN. Gerrit Heck, MD 03/17/2022 11:04:21 AM

## 2022-03-17 NOTE — Progress Notes (Signed)
GASTROENTEROLOGY PROCEDURE H&P NOTE   Primary Care Physician: Debbrah Alar, NP    Reason for Procedure:  Colon Cancer screening  Plan:    Colonoscopy  Patient is appropriate for endoscopic procedure(s) in the ambulatory ([facility]) setting.  The nature of the procedure, as well as the risks, benefits, and alternatives were carefully and thoroughly reviewed with the patient. Ample time for discussion and questions allowed. The patient understood, was satisfied, and agreed to proceed.     HPI: Michelle Villegas is a 71 y.o. female who presents for colonoscopy for routine Colon Cancer screening.  No active GI symptoms.  No known family history of colon cancer or related malignancy.  Patient is otherwise without complaints or active issues today.  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 Authorizing Provider  alendronate (FOSAMAX) 70 MG tablet Take 1 tablet (70 mg total) by mouth every 7 (seven) days. Take with a full glass of water on an empty stomach. 12/10/21  Yes Debbrah Alar, NP  Beta Carotene (VITAMIN A) 25000 UNIT capsule Take 25,000 Units by mouth daily.   Yes [provider]  Biotin 5000 MCG TABS Take by mouth.   Yes [provider]  Calcium Carbonate-Vit D-Min (CALTRATE 600+D PLUS MINERALS) 600-800 MG-UNIT TABS Take 1 tablet by mouth 2 (two) times daily. 12/10/21  Yes Debbrah Alar, NP  FLUoxetine (PROZAC) 40 MG capsule Take 1 capsule (40 mg total) by mouth daily. 03/13/22  Yes Debbrah Alar, NP   folic acid (FOLVITE) 389 MCG tablet Take by mouth daily.   Yes [provider]  magnesium gluconate (MAGONATE) 500 MG tablet Take 500 mg by mouth 2 (two) times daily.   Yes [provider]  Turmeric (QC TUMERIC COMPLEX) 500 MG CAPS Take by mouth.   Yes [provider]  vitamin C (ASCORBIC ACID) 500 MG tablet Take 500 mg by mouth daily.   Yes [provider]  zinc gluconate 50 MG tablet Take 50 mg by mouth daily.   Yes [provider]  albuterol (VENTOLIN HFA) 108 (90 Base) MCG/ACT inhaler Inhale 1-2 puffs into the lungs every 6 (six) hours as needed for wheezing or shortness of breath. 11/19/21   Debbrah Alar, NP  Zoster Vaccine Adjuvanted [facility]) injection 50 mcg IM now and again in 2-6 months 11/19/21   Debbrah Alar, NP    Current Outpatient Medications  Medication Sig Dispense Refill   alendronate (FOSAMAX) 70 MG tablet Take 1 tablet (70 mg total) by mouth every 7 (seven) days. Take with a full glass of water on an empty stomach. 12 tablet 4   Beta Carotene (VITAMIN A) 25000 UNIT capsule Take 25,000 Units by mouth daily.     Biotin 5000 MCG TABS Take by mouth.     Calcium Carbonate-Vit D-Min (CALTRATE 600+D PLUS MINERALS) 600-800 MG-UNIT TABS Take 1 tablet by mouth 2 (two) times daily.     FLUoxetine (PROZAC) 40 MG  capsule Take 1 capsule (40 mg total) by mouth daily. 90 capsule 1   folic acid (FOLVITE) 161 MCG tablet Take by mouth daily.     magnesium gluconate (MAGONATE) 500 MG tablet Take 500 mg by mouth 2 (two) times daily.     Turmeric (QC TUMERIC COMPLEX) 500 MG CAPS Take by mouth.     vitamin C (ASCORBIC ACID) 500 MG tablet Take 500 mg by mouth daily.     zinc gluconate 50 MG tablet Take 50 mg by mouth daily.     albuterol (VENTOLIN HFA) 108 (90 Base) MCG/ACT inhaler Inhale 1-2 puffs into the lungs every 6 (six) hours as needed for wheezing or shortness of breath. 18 g 0   Zoster Vaccine Adjuvanted [facility]) injection 50  mcg IM now and again in 2-6 months 0.5 mL 1   Current Facility-Administered Medications  Medication Dose Route Frequency Provider Last Rate Last Admin   0.9 %  sodium chloride infusion  500 mL Intravenous Once Sidrah Harden V, DO        Allergies as of 03/17/2022 - Review Complete 03/17/2022  Allergen Reaction Noted   Penicillins  10/27/2013    Family History  Problem Relation Age of Onset   Hyperlipidemia Mother    Depression Mother    Breast cancer Mother        in her17's   Cancer Mother    Cancer - Cervical Mother        angiosarcoma   COPD Father    Diabetes Brother    Hearing loss Brother    Kidney disease Brother    Diabetes Maternal Grandmother    Arthritis Maternal Grandfather    Diabetes Paternal Grandmother    Colon cancer Neg Hx    Colon polyps Neg Hx    Esophageal cancer Neg Hx    Rectal cancer Neg Hx    Stomach cancer Neg Hx     Social History   Socioeconomic History   Marital status: Married    Spouse name: Not on file   Number of children: Not on file   Years of education: Not on file   Highest education level: Not on file  Occupational History   Not on file  Tobacco Use   Smoking status: Never   Smokeless tobacco: Not on file  Vaping Use   Vaping Use: Never used  Substance and Sexual Activity   Alcohol use: No   Drug use: No   Sexual activity: Yes    Partners: Male  Other Topics Concern   Not on file  Social History Narrative   Retired Software engineer    2 grown sons, one in [place] and one in [place], granddaughter in [place]   Enjoys Visual merchandiser, gym, visiting family, and beach house   Has a Yorkie   Social Determinants of Health   Financial Resource Strain: Not on file  Food Insecurity: Not on file  Transportation Needs: Not on file  Physical Activity: Sufficiently Active (11/19/2021)   Exercise Vital Sign    Days of Exercise per Week: 4 days    Minutes of Exercise per Session: 90 min  Stress: Not on file  Social Connections:  Not on file  Intimate Partner Violence: Not on file    Physical Exam: Vital signs in last 24 hours: '@BP'$  137/73   Pulse 80   Temp (!) 97.1 F (36.2 C) (Temporal)   Resp 13   Ht '5\' 6"'$  (1.676 m)   Wt 149 lb (67.6 kg)  SpO2 99%   BMI 24.05 kg/m  GEN: NAD EYE: Sclerae anicteric ENT: MMM CV: Non-tachycardic Pulm: CTA b/l GI: Soft, NT/ND NEURO:  Alert & Oriented x 3   Gerrit Heck, DO [facility]   03/17/2022 10:34 AM

## 2022-03-17 NOTE — Progress Notes (Signed)
Pt's states no medical or surgical changes since previsit or office visit. 

## 2022-03-17 NOTE — Progress Notes (Signed)
Called to room to assist during endoscopic procedure.  Patient ID and intended procedure confirmed with present staff. Received instructions for my participation in the procedure from the performing physician.  

## 2022-03-17 NOTE — Progress Notes (Signed)
Report to PACU, RN, vss, BBS= Clear.  

## 2022-03-17 NOTE — Patient Instructions (Signed)
Handouts on polyps and diverticulosis given to you today  Await pathology results   YOU HAD AN ENDOSCOPIC PROCEDURE TODAY AT THE [facility]:   Refer to the procedure report that was given to you for any specific questions about what was found during the examination.  If the procedure report does not answer your questions, please call your gastroenterologist to clarify.  If you requested that your care partner not be given the details of your procedure findings, then the procedure report has been included in a sealed envelope for you to review at your convenience later.  YOU SHOULD EXPECT: Some feelings of bloating in the abdomen. Passage of more gas than usual.  Walking can help get rid of the air that was put into your GI tract during the procedure and reduce the bloating. If you had a lower endoscopy (such as a colonoscopy or flexible sigmoidoscopy) you may notice spotting of blood in your stool or on the toilet paper. If you underwent a bowel prep for your procedure, you may not have a normal bowel movement for a few days.  Please Note:  You might notice some irritation and congestion in your nose or some drainage.  This is from the oxygen used during your procedure.  There is no need for concern and it should clear up in a day or so.  SYMPTOMS TO REPORT IMMEDIATELY:  Following lower endoscopy (colonoscopy or flexible sigmoidoscopy):  Excessive amounts of blood in the stool  Significant tenderness or worsening of abdominal pains  Swelling of the abdomen that is new, acute  Fever of 100F or higher  For urgent or emergent issues, a gastroenterologist can be reached at any hour by calling [phone]. Do not use MyChart messaging for urgent concerns.    DIET:  We do recommend a small meal at first, but then you may proceed to your regular diet.  Drink plenty of fluids but you should avoid alcoholic beverages for 24 hours.  ACTIVITY:  You should plan to take it easy for the  rest of today and you should NOT DRIVE or use heavy machinery until tomorrow (because of the sedation medicines used during the test).    FOLLOW UP: Our staff will call the number listed on your records the next business day following your procedure.  We will call around 7:15- 8:00 am to check on you and address any questions or concerns that you may have regarding the information given to you following your procedure. If we do not reach you, we will leave a message.     If any biopsies were taken you will be contacted by phone or by letter within the next 1-3 weeks.  Please call us at [phone] if you have not heard about the biopsies in 3 weeks.    SIGNATURES/CONFIDENTIALITY: You and/or your care partner have signed paperwork which will be entered into your electronic medical record.  These signatures attest to the fact that that the information above on your After Visit Summary has been reviewed and is understood.  Full responsibility of the confidentiality of this discharge information lies with you and/or your care-partner. 

## 2022-03-18 ENCOUNTER — Telehealth: Payer: Self-pay | Admitting: *Deleted

## 2022-03-18 NOTE — Telephone Encounter (Signed)
  Follow up Call-     03/17/2022    9:36 AM  Call back number  Post procedure Call Back phone  # [phone]  Permission to leave phone message Yes     Patient questions:  Do you have a fever, pain , or abdominal swelling? No. Pain Score  0 *  Have you tolerated food without any problems? Yes.    Have you been able to return to your normal activities? Yes.    Do you have any questions about your discharge instructions: Diet   No. Medications  No. Follow up visit  No.  Do you have questions or concerns about your Care? No.  Actions: * If pain score is 4 or above: No action needed, pain <4.

## 2022-03-22 ENCOUNTER — Encounter: Payer: Self-pay | Admitting: Gastroenterology

## 2022-05-01 ENCOUNTER — Telehealth (INDEPENDENT_AMBULATORY_CARE_PROVIDER_SITE_OTHER): Payer: Medicare Other | Admitting: Family

## 2022-05-01 DIAGNOSIS — F32A Depression, unspecified: Secondary | ICD-10-CM | POA: Diagnosis not present

## 2022-05-01 NOTE — Progress Notes (Signed)
MyChart Video Visit    Virtual Visit via Video Note   This visit type was conducted due to national recommendations for restrictions regarding the COVID-19 Pandemic (e.g. social distancing) in an effort to limit this patient's exposure and mitigate transmission in our community. This patient is at least at moderate risk for complications without adequate follow up. This format is felt to be most appropriate for this patient at this time. Physical exam was limited by quality of the video and audio technology used for the visit. CMA was able to get the patient set up on a video visit.  Patient location: Home Patient and provider in visit Provider location: Office  I discussed the limitations of evaluation and management by telemedicine and the availability of in person appointments. The patient expressed understanding and agreed to proceed.  Visit Date: 05/01/2022  Today's healthcare provider: Nance Pear, NP     Subjective:    Patient ID: Michelle Villegas, female    DOB: Jan 11, 1951, 71 y.o.   MRN: [id number]  Chief Complaint  Patient presents with   6 week follow up    Depression: Increased her prozac from '20mg'$  to '40mg'$  at last OV.     HPI Patient is in today for a video follow up visit.   During last visit she was under stress due to her mother being in the memory care unit.  We increased her Prozac from 20 mg to 40 mg. She reports her mood has improved while taking it. She has no new side effects while taking 40 mg Prozac. She wishes to continue this dose.   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   H/O: cesarean section 1984   Heart murmur    History of tonsillectomy    Osteoporosis    Tubal pregnancy 1984    Past Surgical History:  Procedure Laterality Date   CESAREAN SECTION     X 2   ECTOPIC PREGNANCY SURGERY     KIDNEY DONATION Left 2001   TONSILLECTOMY       Family History  Problem Relation Age of Onset   Hyperlipidemia Mother    Depression Mother    Breast cancer Mother        in her40's   Cancer Mother    Cancer - Cervical Mother        angiosarcoma   COPD Father    Diabetes Brother    Hearing loss Brother    Kidney disease Brother    Diabetes Maternal Grandmother    Arthritis Maternal Grandfather    Diabetes Paternal Grandmother    Colon cancer Neg Hx    Colon polyps Neg Hx    Esophageal cancer Neg Hx    Rectal cancer Neg Hx    Stomach cancer Neg Hx     Social History   Socioeconomic History   Marital status: Married    Spouse name: Not on file   Number of children: Not on file   Years of education: Not on file   Highest education level: Not on file  Occupational History   Not on file  Tobacco Use   Smoking status: Never   Smokeless tobacco: Not on file  Vaping Use   Vaping Use: Never used  Substance and Sexual Activity   Alcohol use: No   Drug use: No   Sexual activity: Yes    Partners: Male  Other Topics Concern   Not on file  Social History Narrative   Retired Software engineer    2 grown sons, one in [place] and one in [place], granddaughter in [place]   Enjoys [place], gym, visiting family, and beach house   Has a Yorkie   Social Determinants of Health   Financial Resource Strain: Not on [employer] Insecurity: Not on file  Transportation Needs: Not on file  Physical Activity: Sufficiently Active (11/19/2021)   Exercise Vital Sign    Days of Exercise per Week: 4 days    Minutes of Exercise per Session: 90 min  Stress: Not on file  Social Connections: Not on file  Intimate Partner Violence: Not on file    Outpatient Medications Prior to Visit  Medication Sig Dispense Refill   albuterol (VENTOLIN HFA) 108 (90 Base) MCG/ACT inhaler Inhale 1-2 puffs into the lungs every 6 (six) hours as needed for wheezing or shortness of breath. 18 g 0   alendronate (FOSAMAX) 70 MG tablet Take 1 tablet (70 mg  total) by mouth every 7 (seven) days. Take with a full glass of water on an empty stomach. 12 tablet 4   Beta Carotene (VITAMIN A) 25000 UNIT capsule Take 25,000 Units by mouth daily.     Biotin 5000 MCG TABS Take by mouth.     Calcium Carbonate-Vit D-Min (CALTRATE 600+D PLUS MINERALS) 600-800 MG-UNIT TABS Take 1 tablet by mouth 2 (two) times daily.     FLUoxetine (PROZAC) 40 MG capsule Take 1 capsule (40 mg total) by mouth daily. 90 capsule 1   folic acid (FOLVITE) 660 MCG tablet Take by mouth daily.     magnesium gluconate (MAGONATE) 500 MG tablet Take 500 mg by mouth 2 (two) times daily.     Turmeric (QC TUMERIC COMPLEX) 500 MG CAPS Take by mouth.     vitamin C (ASCORBIC ACID) 500 MG tablet Take 500 mg by mouth daily.     zinc gluconate 50 MG tablet Take 50 mg by mouth daily.     Zoster Vaccine Adjuvanted [facility]) injection 50 mcg IM now and again in 2-6 months 0.5 mL 1   No facility-administered medications prior to visit.    Allergies  Allergen Reactions   Penicillins     Unsure of reaction. Childhood allergy.    ROS See HPI    Objective:      There were no vitals taken for this visit. Wt Readings from Last 3 Encounters:  03/17/22 149 lb (67.6 kg)  03/13/22 148 lb (67.1 kg)  02/27/22 149 lb (67.6 kg)   Gen: Awake, alert, no acute distress Resp: Breathing is even and non-labored Psych: calm/pleasant demeanor Neuro: Alert and Oriented x 3, + facial symmetry, speech is clear.     Assessment & Plan:   Problem List Items Addressed This Visit       Unprioritized   Depression - Primary    Symptoms are improved on prozac '40mg'$ .  Continue current dose. Plan follow up in 6 months.         No orders of the defined types were placed in this encounter.   I discussed the assessment and treatment plan with the patient. The patient was provided an opportunity to ask questions and all were answered. The patient agreed with the plan and demonstrated an understanding of the  instructions.   The patient was advised to call back or seek an in-person evaluation if the symptoms worsen or if the condition fails to improve  as anticipated.  I provided 20 minutes of face-to-face time during this encounter.   I,Shehryar Multimedia programmer as a Education administrator for [employer], NP.,have documented all relevant documentation on the behalf of Nance Pear, NP,as directed by  Nance Pear, NP while in the presence of Nance Pear, NP.   Nance Pear, NP [employer] at [employer] [phone] (phone) [phone] (fax)  [place]

## 2022-05-01 NOTE — Assessment & Plan Note (Signed)
Symptoms are improved on prozac '40mg'$ .  Continue current dose. Plan follow up in 6 months.

## 2022-05-20 ENCOUNTER — Ambulatory Visit (INDEPENDENT_AMBULATORY_CARE_PROVIDER_SITE_OTHER): Payer: Medicare Other

## 2022-05-20 VITALS — Wt 149.0 lb

## 2022-05-20 DIAGNOSIS — Z0000 Encounter for general adult medical examination without abnormal findings: Secondary | ICD-10-CM

## 2022-05-20 NOTE — Progress Notes (Signed)
I connected with  Michelle Villegas on 05/20/22 by a audio enabled telemedicine application and verified that I am speaking with the correct person using two identifiers.  Patient Location: Home  Provider Location: Home Office  I discussed the limitations of evaluation and management by telemedicine. The patient expressed understanding and agreed to proceed.   Subjective:   Michelle Villegas is a 71 y.o. female who presents for an Initial Medicare Annual Wellness Visit.  Review of Systems     Cardiac Risk Factors include: advanced age (>68mn, >>71women)     Objective:    Today's Vitals   05/20/22 1316  Weight: 149 lb (67.6 kg)   Body mass index is 24.05 kg/m.     05/20/2022    1:21 PM 10/23/2021   10:27 AM  Advanced Directives  Does Patient Have a Medical Advance Directive? Yes Yes  Type of AParamedicof [place] will [place] will  Copy of [place] Chart? No - copy requested     Current Medications (verified) Outpatient Encounter Medications as of 05/20/2022  Medication Sig   alendronate (FOSAMAX) 70 MG tablet Take 1 tablet (70 mg total) by mouth every 7 (seven) days. Take with a full glass of water on an empty stomach.   Beta Carotene (VITAMIN A) 25000 UNIT capsule Take 25,000 Units by mouth daily.   Biotin 5000 MCG TABS Take by mouth.   Calcium Carbonate-Vit D-Min (CALTRATE 600+D PLUS MINERALS) 600-800 MG-UNIT TABS Take 1 tablet by mouth 2 (two) times daily.   FLUoxetine (PROZAC) 40 MG capsule Take 1 capsule (40 mg total) by mouth daily.   folic acid (FOLVITE) 8998MCG tablet Take by mouth daily.   magnesium gluconate (MAGONATE) 500 MG tablet Take 500 mg by mouth 2 (two) times daily.   Turmeric (QC TUMERIC COMPLEX) 500 MG CAPS Take by mouth.   vitamin C (ASCORBIC ACID) 500 MG tablet Take 500 mg by mouth daily.   zinc gluconate 50 MG tablet Take 50 mg by mouth daily.   Zoster Vaccine  Adjuvanted ([facility] injection 50 mcg IM now and again in 2-6 months   [DISCONTINUED] albuterol (VENTOLIN HFA) 108 (90 Base) MCG/ACT inhaler Inhale 1-2 puffs into the lungs every 6 (six) hours as needed for wheezing or shortness of breath.   No facility-administered encounter medications on file as of 05/20/2022.    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   H/O: cesarean section 1984   Heart murmur    History of tonsillectomy    Osteoporosis    Tubal pregnancy 1984   Past Surgical History:  Procedure Laterality Date   CESAREAN SECTION     X 2   ECTOPIC PREGNANCY SURGERY     KIDNEY DONATION Left 2001   TONSILLECTOMY     Family History  Problem Relation Age of Onset   Hyperlipidemia Mother    Depression Mother    Breast cancer Mother        in hher20's  Cancer Mother    Cancer - Cervical Mother        angiosarcoma   COPD Father    Diabetes Brother    Hearing loss Brother    Kidney disease Brother    Diabetes Maternal Grandmother    Arthritis Maternal Grandfather    Diabetes Paternal Grandmother  Colon cancer Neg Hx    Colon polyps Neg Hx    Esophageal cancer Neg Hx    Rectal cancer Neg Hx    Stomach cancer Neg Hx    Social History   Socioeconomic History   Marital status: Married    Spouse name: Not on file   Number of children: Not on file   Years of education: Not on file   Highest education level: Not on file  Occupational History   Not on file  Tobacco Use   Smoking status: Never   Smokeless tobacco: Not on file  Vaping Use   Vaping Use: Never used  Substance and Sexual Activity   Alcohol use: No   Drug use: No   Sexual activity: Yes    Partners: Male  Other Topics Concern   Not on file  Social History Narrative   Retired Software engineer    2 grown sons, one in [place] and one in [place], granddaughter in  [place]   Enjoys [place], gym, visiting family, and beach house   Has a Yorkie   Social Determinants of Health   Financial Resource Strain: [place]  (05/20/2022)   Overall Financial Resource Strain (CARDIA)    Difficulty of Paying Living Expenses: Not hard at all  Food Insecurity: No Food Insecurity (05/20/2022)   Hunger Vital Sign    Worried About Running Out of Food in the Last Year: Never true    [place] in the Last Year: Never true  Transportation Needs: No Transportation Needs (05/20/2022)   PRAPARE - Hydrologist (Medical): No    Lack of Transportation (Non-Medical): No  Physical Activity: Sufficiently Active (05/20/2022)   Exercise Vital Sign    Days of Exercise per Week: 4 days    Minutes of Exercise per Session: 90 min  Stress: No Stress Concern Present (05/20/2022)   [place]    Feeling of Stress : Only a little  Social Connections: Moderately Integrated (05/20/2022)   Social Connection and Isolation Panel [NHANES]    Frequency of Communication with Friends and Family: More than three times a week    Frequency of Social Gatherings with Friends and Family: More than three times a week    Attends Religious Services: Never    Marine scientist or Organizations: Yes    Attends Music therapist: 1 to 4 times per year    Marital Status: Married    Tobacco Counseling Counseling given: Not Answered   Clinical Intake:  Pre-visit preparation completed: Yes  Pain : No/denies pain     BMI - recorded: 24.05 Nutritional Status: BMI of 19-24  Normal Nutritional Risks: None Diabetes: No  How often do you need to have someone help you when you read instructions, pamphlets, or other written materials from your doctor or pharmacy?: 1 - Never  Diabetic?no  Interpreter Needed?: No  Information entered by :: Charlott Rakes, LPN   Activities of  Daily Living    05/20/2022    1:23 PM  In your present state of health, do you have any difficulty performing the following activities:  Hearing? 1  Comment tinnitus  Vision? 0  Difficulty concentrating or making decisions? 0  Walking or climbing stairs? 0  Dressing or bathing? 0  Doing errands, shopping? 0  Preparing Food and eating ? N  Using the Toilet? N  In the past six months, have you accidently leaked  urine? N  Do you have problems with loss of bowel control? N  Managing your Medications? N  Managing your Finances? N  Housekeeping or managing your Housekeeping? N    Patient Care Team: Debbrah Alar, NP as PCP - General (Internal Medicine)  Indicate any recent Medical Services you may have received from other than [facility] providers in the past year (date may be approximate).     Assessment:   This is a routine wellness examination for [facility].  Hearing/Vision screen Hearing Screening - Comments:: Pt stated tinnitus at times Vision Screening - Comments:: Pt follows up with [facility] for annual eye exams   Dietary issues and exercise activities discussed: Current Exercise Habits: Home exercise routine, Type of exercise: walking;Other - see comments, Time (Minutes): > 60, Frequency (Times/Week): 4, Weekly Exercise (Minutes/Week): 0   Goals Addressed             This Visit's Progress    Patient Stated       Start walking 1 mile a day        Depression Screen    05/20/2022    1:18 PM 11/19/2021    1:36 PM  PHQ 2/9 Scores  PHQ - 2 Score 2 3  PHQ- 9 Score 2 10    Fall Risk     No data to display          [place]:  Any stairs in or around the home? Yes  If so, are there any without handrails? No  Home free of loose throw rugs in walkways, pet beds, electrical cords, etc? Yes  Adequate lighting in your home to reduce risk of falls? Yes   ASSISTIVE DEVICES UTILIZED TO PREVENT FALLS:  Life  alert? No  Use of a cane, walker or w/c? No  Grab bars in the bathroom? Yes  Shower chair or bench in shower? Yes  Elevated toilet seat or a handicapped toilet? No   TIMED UP AND GO:  Was the test performed? No .   Cognitive Function:        05/20/2022    1:23 PM  6CIT Screen  What Year? 0 points  What month? 0 points  What time? 0 points  Count back from 20 0 points  Months in reverse 0 points  Repeat phrase 0 points  Total Score 0 points    Immunizations Immunization History  Administered Date(s) Administered   Fluad Quad(high Dose 65+) 03/18/2022   Hepatitis A 07/19/2003, 01/23/2004   PFIZER SARS-COV-2 Pediatric Vaccination 5-59yr 02/21/2021   PFIZER(Purple Top)SARS-COV-2 Vaccination 06/23/2019, 07/14/2019, 02/28/2020, 08/27/2020   PNEUMOCOCCAL CONJUGATE-20 11/19/2021   Pneumococcal Conjugate-13 09/24/2015   Respiratory Syncytial Virus Vaccine,Recomb Aduvanted(Arexvy) 04/29/2022   Td 01/19/2016   Tdap 04/10/2018   Unspecified SARS-COV-2 Vaccination 03/23/2022   Zoster Recombinat (Shingrix) 12/09/2021   Zoster, Live 08/23/2012    TDAP status: Up to date  Flu Vaccine status: Up to date  Pneumococcal vaccine status: Up to date  Covid-19 vaccine status: Completed vaccines  Qualifies for Shingles Vaccine? Yes   Zostavax completed Yes   Shingrix Completed?: Yes  Screening Tests Health Maintenance  Topic Date Due   Hepatitis C Screening  Never done   Zoster Vaccines- Shingrix (2 of 2) 02/03/2022   COVID-19 Vaccine (6 - 2023-24 season) 05/18/2022   Medicare Annual Wellness (AWV)  05/21/2023   MAMMOGRAM  12/09/2023   DTaP/Tdap/Td (3 - Td or Tdap) 04/10/2028   Pneumonia Vaccine 65+ Years  old  Completed   INFLUENZA VACCINE  Completed   DEXA SCAN  Completed   HPV VACCINES  Aged Out   COLONOSCOPY (Pts 45-39yr Insurance coverage will need to be confirmed)  Discontinued    Health Maintenance  Health Maintenance Due  Topic Date Due   Hepatitis C  Screening  Never done   Zoster Vaccines- Shingrix (2 of 2) 02/03/2022   COVID-19 Vaccine (6 - 2023-24 season) 05/18/2022    Colorectal cancer screening: Type of screening: Colonoscopy. Completed 03/17/22. Repeat every as directed years  Mammogram status: Completed 12/08/21. Repeat every year  Bone Density status: Completed 12/08/21. Results reflect: Bone density results: OSTEOPOROSIS. Repeat every 2 years.   Additional Screening:  Hepatitis C Screening: does qualify;   Vision Screening: Recommended annual ophthalmology exams for early detection of glaucoma and other disorders of the eye. Is the patient up to date with their annual eye exam?  Yes  Who is the provider or what is the name of the office in which the patient attends annual eye exams? [facility]  If pt is not established with a provider, would they like to be referred to a provider to establish care? No .   Dental Screening: Recommended annual dental exams for proper oral hygiene  Community Resource Referral / Chronic Care Management: CRR required this visit?  No   CCM required this visit?  No      Plan:     I have personally reviewed and noted the following in the patient's chart:   Medical and social history Use of alcohol, tobacco or illicit drugs  Current medications and supplements including opioid prescriptions. Patient is not currently taking opioid prescriptions. Functional ability and status Nutritional status Physical activity Advanced directives List of other physicians Hospitalizations, surgeries, and ER visits in previous 12 months Vitals Screenings to include cognitive, depression, and falls Referrals and appointments  In addition, I have reviewed and discussed with patient certain preventive protocols, quality metrics, and best practice recommendations. A written personalized care plan for preventive services as well as general preventive health recommendations were provided to patient.      TWillette Brace LPN   [id number]  Nurse Notes: none

## 2022-05-20 NOTE — Patient Instructions (Signed)
Michelle Villegas , Thank you for taking time to come for your Medicare Wellness Visit. I appreciate your ongoing commitment to your health goals. Please review the following plan we discussed and let me know if I can assist you in the future.   These are the goals we discussed:  Goals   None     This is a list of the screening recommended for you and due dates:  Health Maintenance  Topic Date Due   Hepatitis C Screening: USPSTF Recommendation to screen - Ages 76-79 yo.  Never done   Zoster (Shingles) Vaccine (2 of 2) 02/03/2022   COVID-19 Vaccine (6 - 2023-24 season) 05/18/2022   Medicare Annual Wellness Visit  05/21/2023   Mammogram  12/09/2023   DTaP/Tdap/Td vaccine (3 - Td or Tdap) 04/10/2028   Pneumonia Vaccine  Completed   Flu Shot  Completed   DEXA scan (bone density measurement)  Completed   HPV Vaccine  Aged Out   Colon Cancer Screening  Discontinued    Advanced directives: Please bring a copy of your health care power of attorney and living will to the office at your convenience.   Conditions/risks identified: start walking 1 mile a day   Next appointment: Follow up in one year for your annual wellness visit    Preventive Care 65 Years and Older, Female Preventive care refers to lifestyle choices and visits with your health care provider that can promote health and wellness. What does preventive care include? A yearly physical exam. This is also called an annual well check. Dental exams once or twice a year. Routine eye exams. Ask your health care provider how often you should have your eyes checked. Personal lifestyle choices, including: Daily care of your teeth and gums. Regular physical activity. Eating a healthy diet. Avoiding tobacco and drug use. Limiting alcohol use. Practicing safe sex. Taking low-dose aspirin every day. Taking vitamin and mineral supplements as recommended by your health care provider. What happens during an annual well check? The services  and screenings done by your health care provider during your annual well check will depend on your age, overall health, lifestyle risk factors, and family history of disease. Counseling  Your health care provider may ask you questions about your: Alcohol use. Tobacco use. Drug use. Emotional well-being. Home and relationship well-being. Sexual activity. Eating habits. History of falls. Memory and ability to understand (cognition). Work and work Statistician. Reproductive health. Screening  You may have the following tests or measurements: Height, weight, and BMI. Blood pressure. Lipid and cholesterol levels. These may be checked every 5 years, or more frequently if you are over 78 years old. Skin check. Lung cancer screening. You may have this screening every year starting at age 48 if you have a 30-pack-year history of smoking and currently smoke or have quit within the past 15 years. Fecal occult blood test (FOBT) of the stool. You may have this test every year starting at age 30. Flexible sigmoidoscopy or colonoscopy. You may have a sigmoidoscopy every 5 years or a colonoscopy every 10 years starting at age 26. Hepatitis C blood test. Hepatitis B blood test. Sexually transmitted disease (STD) testing. Diabetes screening. This is done by checking your blood sugar (glucose) after you have not eaten for a while (fasting). You may have this done every 1-3 years. Bone density scan. This is done to screen for osteoporosis. You may have this done starting at age 70. Mammogram. This may be done every 1-2 years. Talk to  your health care provider about how often you should have regular mammograms. Talk with your health care provider about your test results, treatment options, and if necessary, the need for more tests. Vaccines  Your health care provider may recommend certain vaccines, such as: Influenza vaccine. This is recommended every year. Tetanus, diphtheria, and acellular pertussis  (Tdap, Td) vaccine. You may need a Td booster every 10 years. Zoster vaccine. You may need this after age 62. Pneumococcal 13-valent conjugate (PCV13) vaccine. One dose is recommended after age 86. Pneumococcal polysaccharide (PPSV23) vaccine. One dose is recommended after age 62. Talk to your health care provider about which screenings and vaccines you need and how often you need them. This information is not intended to replace advice given to you by your health care provider. Make sure you discuss any questions you have with your health care provider. Document Released: 06/14/2015 Document Revised: 02/05/2016 Document Reviewed: 03/19/2015 Elsevier Interactive Patient Education  2017 [place] Prevention in the Home Falls can cause injuries. They can happen to people of all ages. There are many things you can do to make your home safe and to help prevent falls. What can I do on the outside of my home? Regularly fix the edges of walkways and driveways and fix any cracks. Remove anything that might make you trip as you walk through a door, such as a raised step or threshold. Trim any bushes or trees on the path to your home. Use bright outdoor lighting. Clear any walking paths of anything that might make someone trip, such as rocks or tools. Regularly check to see if handrails are loose or broken. Make sure that both sides of any steps have handrails. Any raised decks and porches should have guardrails on the edges. Have any leaves, snow, or ice cleared regularly. Use sand or salt on walking paths during winter. Clean up any spills in your garage right away. This includes oil or grease spills. What can I do in the bathroom? Use night lights. Install grab bars by the toilet and in the tub and shower. Do not use towel bars as grab bars. Use non-skid mats or decals in the tub or shower. If you need to sit down in the shower, use a plastic, non-slip stool. Keep the floor dry. Clean  up any water that spills on the floor as soon as it happens. Remove soap buildup in the tub or shower regularly. Attach bath mats securely with double-sided non-slip rug tape. Do not have throw rugs and other things on the floor that can make you trip. What can I do in the bedroom? Use night lights. Make sure that you have a light by your bed that is easy to reach. Do not use any sheets or blankets that are too big for your bed. They should not hang down onto the floor. Have a firm chair that has side arms. You can use this for support while you get dressed. Do not have throw rugs and other things on the floor that can make you trip. What can I do in the kitchen? Clean up any spills right away. Avoid walking on wet floors. Keep items that you use a lot in easy-to-reach places. If you need to reach something above you, use a strong step stool that has a grab bar. Keep electrical cords out of the way. Do not use floor polish or wax that makes floors slippery. If you must use wax, use non-skid floor wax. Do not  have throw rugs and other things on the floor that can make you trip. What can I do with my stairs? Do not leave any items on the stairs. Make sure that there are handrails on both sides of the stairs and use them. Fix handrails that are broken or loose. Make sure that handrails are as long as the stairways. Check any carpeting to make sure that it is firmly attached to the stairs. Fix any carpet that is loose or worn. Avoid having throw rugs at the top or bottom of the stairs. If you do have throw rugs, attach them to the floor with carpet tape. Make sure that you have a light switch at the top of the stairs and the bottom of the stairs. If you do not have them, ask someone to add them for you. What else can I do to help prevent falls? Wear shoes that: Do not have high heels. Have rubber bottoms. Are comfortable and fit you well. Are closed at the toe. Do not wear sandals. If you  use a stepladder: Make sure that it is fully opened. Do not climb a closed stepladder. Make sure that both sides of the stepladder are locked into place. Ask someone to hold it for you, if possible. Clearly mark and make sure that you can see: Any grab bars or handrails. First and last steps. Where the edge of each step is. Use tools that help you move around (mobility aids) if they are needed. These include: Canes. Walkers. Scooters. Crutches. Turn on the lights when you go into a dark area. Replace any light bulbs as soon as they burn out. Set up your furniture so you have a clear path. Avoid moving your furniture around. If any of your floors are uneven, fix them. If there are any pets around you, be aware of where they are. Review your medicines with your doctor. Some medicines can make you feel dizzy. This can increase your chance of falling. Ask your doctor what other things that you can do to help prevent falls. This information is not intended to replace advice given to you by your health care provider. Make sure you discuss any questions you have with your health care provider. Document Released: 03/14/2009 Document Revised: 10/24/2015 Document Reviewed: 06/22/2014 Elsevier Interactive Patient Education  2017 [employer].

## 2022-09-09 ENCOUNTER — Other Ambulatory Visit: Payer: Self-pay | Admitting: Family

## 2022-09-25 ENCOUNTER — Other Ambulatory Visit: Payer: Self-pay | Admitting: Family

## 2022-09-25 MED ORDER — FLUOXETINE HCL 40 MG PO CAPS: 40.0000 mg | ORAL_CAPSULE | Freq: Every day | ORAL | 0 refills | Status: DC

## 2022-11-10 ENCOUNTER — Ambulatory Visit: Payer: Medicare Other | Admitting: Family

## 2022-11-17 ENCOUNTER — Ambulatory Visit (INDEPENDENT_AMBULATORY_CARE_PROVIDER_SITE_OTHER): Payer: Medicare Other | Admitting: Family

## 2022-11-17 ENCOUNTER — Telehealth: Payer: Self-pay | Admitting: Family

## 2022-11-17 VITALS — BP 137/78 | HR 88 | Temp 97.0°F | Wt 153.0 lb

## 2022-11-17 DIAGNOSIS — F32A Depression, unspecified: Secondary | ICD-10-CM

## 2022-11-17 DIAGNOSIS — L989 Disorder of the skin and subcutaneous tissue, unspecified: Secondary | ICD-10-CM

## 2022-11-17 DIAGNOSIS — R32 Unspecified urinary incontinence: Secondary | ICD-10-CM | POA: Insufficient documentation

## 2022-11-17 MED ORDER — OXYBUTYNIN CHLORIDE ER 5 MG PO TB24: 5.0000 mg | ORAL_TABLET | Freq: Every day | ORAL | 1 refills | Status: DC

## 2022-11-17 NOTE — Assessment & Plan Note (Signed)
Reports sudden urge to urinate and has had three episodes of incontinence in the past few months. No stress incontinence or dysuria. -Start Oxybutynin (Ditropan) and monitor for efficacy and side effects such as dry mouth.

## 2022-11-17 NOTE — Progress Notes (Signed)
Subjective:     Patient ID: Michelle Villegas, female    DOB: 09-16-1950, 72 y.o.   MRN: [id number]  Chief Complaint  Patient presents with   Depression    Here for follow up   Skin Problem    Two spots on face getting darker   Urinary Incontinence    Complains of urge incontinence    Depression         Discussed the use of AI scribe software for clinical note transcription with the patient, who gave verbal consent to proceed.  History of Present Illness   The patient, with a history of depression managed on Prozac, presents with increased stress and new onset urinary incontinence. She recently lost her mother and is managing the estate alone, which has been a significant source of stress. She reports stress eating but has only gained four pounds since December. She has declined counseling due to time constraints. She is trying to stay positive and have found a new church home. She also ha new sunspots on their face, which she would like evaluated by a dermatologist.  The patient has also been experiencing urinary incontinence. She describes it as a sudden urge to urinate and if she doesn't go immediately, she loses control of their bladder. This has happened three times in the past few months. She denies any incontinence with coughing or sneezing and no burning or frequency.      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   H/O: cesarean section 1984   Heart murmur    History of tonsillectomy    Osteoporosis    Tubal pregnancy 1984    Past Surgical History:  Procedure Laterality Date   CESAREAN SECTION     X 2   ECTOPIC  PREGNANCY SURGERY     KIDNEY DONATION Left 2001   TONSILLECTOMY      Family History  Problem Relation Age of Onset   Hyperlipidemia Mother    Depression Mother    Breast cancer Mother        in her73's   Cancer Mother    Cancer - Cervical Mother        angiosarcoma   COPD Father    Diabetes Brother    Hearing loss Brother    Kidney disease Brother    Diabetes Maternal Grandmother    Arthritis Maternal Grandfather    Diabetes Paternal Grandmother    Colon cancer Neg Hx    Colon polyps Neg Hx    Esophageal cancer Neg Hx    Rectal cancer Neg Hx    Stomach cancer Neg Hx     Social History   Socioeconomic History   Marital status: Married    Spouse name: Not on file   Number of children: Not on file   Years of education: Not on file   Highest education level: Not on file  Occupational History   Not on file  Tobacco Use   Smoking status: Never   Smokeless tobacco: Not on file  Vaping Use   Vaping Use: Never  used  Substance and Sexual Activity   Alcohol use: No   Drug use: No   Sexual activity: Yes    Partners: Male  Other Topics Concern   Not on file  Social History Narrative   Retired Teacher, early years/pre    2 grown sons, one in [place] and one in [place], granddaughter in [place]   Enjoys [place], gym, visiting family, and beach house   Has a Yorkie   Social Determinants of Health   Financial Resource Strain: Low Risk  (05/20/2022)   Overall Financial Resource Strain (CARDIA)    Difficulty of Paying Living Expenses: Not hard at all  Food Insecurity: No Food Insecurity (05/20/2022)   Hunger Vital Sign    Worried About Running Out of Food in the Last Year: Never true    Ran Out of Food in the Last Year: Never true  Transportation Needs: No Transportation Needs (05/20/2022)   PRAPARE - Administrator, Civil Service (Medical): No    Lack of Transportation (Non-Medical): No  Physical Activity: Sufficiently Active (05/20/2022)   Exercise Vital Sign     Days of Exercise per Week: 4 days    Minutes of Exercise per Session: 90 min  Stress: No Stress Concern Present (05/20/2022)   [employer] of Occupational Health - Occupational Stress Questionnaire    Feeling of Stress : Only a little  Social Connections: Moderately Integrated (05/20/2022)   Social Connection and Isolation Panel [NHANES]    Frequency of Communication with Friends and Family: More than three times a week    Frequency of Social Gatherings with Friends and Family: More than three times a week    Attends Religious Services: Never    Database administrator or Organizations: Yes    Attends Banker Meetings: 1 to 4 times per year    Marital Status: Married  Catering manager Violence: Not At Risk (05/20/2022)   Humiliation, Afraid, Rape, and Kick questionnaire    Fear of Current or Ex-Partner: No    Emotionally Abused: No    Physically Abused: No    Sexually Abused: No    Outpatient Medications Prior to Visit  Medication Sig Dispense Refill   alendronate (FOSAMAX) 70 MG tablet Take 1 tablet (70 mg total) by mouth every 7 (seven) days. Take with a full glass of water on an empty stomach. 12 tablet 4   Beta Carotene (VITAMIN A) 25000 UNIT capsule Take 25,000 Units by mouth daily.     Biotin 5000 MCG TABS Take by mouth.     Calcium Carbonate-Vit D-Min (CALTRATE 600+D PLUS MINERALS) 600-800 MG-UNIT TABS Take 1 tablet by mouth 2 (two) times daily.     FLUoxetine (PROZAC) 40 MG capsule Take 1 capsule (40 mg total) by mouth daily. 90 capsule 0   folic acid (FOLVITE) 800 MCG tablet Take by mouth daily.     magnesium gluconate (MAGONATE) 500 MG tablet Take 500 mg by mouth 2 (two) times daily.     Turmeric (QC TUMERIC COMPLEX) 500 MG CAPS Take by mouth.     vitamin C (ASCORBIC ACID) 500 MG tablet Take 500 mg by mouth daily.     zinc gluconate 50 MG tablet Take 50 mg by mouth daily.     Zoster Vaccine Adjuvanted [facility]) injection 50 mcg IM now and again in 2-6  months 0.5 mL 1   No facility-administered medications prior to visit.    Allergies  Allergen Reactions   Penicillins  Unsure of reaction. Childhood allergy.    Review of Systems  Psychiatric/Behavioral:  Positive for depression.    See HPI    Objective:    Physical Exam Constitutional:      General: She is not in acute distress.    Appearance: Normal appearance. She is well-developed.  HENT:     Head: Normocephalic and atraumatic.     Right Ear: External ear normal.     Left Ear: External ear normal.  Eyes:     General: No scleral icterus. Neck:     Thyroid: No thyromegaly.  Cardiovascular:     Rate and Rhythm: Normal rate and regular rhythm.     Heart sounds: Normal heart sounds. No murmur heard. Pulmonary:     Effort: Pulmonary effort is normal. No respiratory distress.     Breath sounds: Normal breath sounds. No wheezing.  Musculoskeletal:     Cervical back: Neck supple.  Skin:    General: Skin is warm and dry.     Comments: Two flat skin lesions on left cheek, hyperpigmented  Neurological:     Mental Status: She is alert and oriented to person, place, and time.  Psychiatric:        Mood and Affect: Mood normal.        Behavior: Behavior normal.        Thought Content: Thought content normal.        Judgment: Judgment normal.      BP 137/78 (BP Location: Right Arm, Patient Position: Sitting, Cuff Size: Small)   Pulse 88   Temp (!) 97 F (36.1 C) (Oral)   Wt 153 lb (69.4 kg)   SpO2 99%   BMI 24.69 kg/m  Wt Readings from Last 3 Encounters:  11/17/22 153 lb (69.4 kg)  05/20/22 149 lb (67.6 kg)  03/17/22 149 lb (67.6 kg)       Assessment & Plan:   Problem List Items Addressed This Visit       Unprioritized   Urinary incontinence    Reports sudden urge to urinate and has had three episodes of incontinence in the past few months. No stress incontinence or dysuria. -Start Oxybutynin (Ditropan) and monitor for efficacy and side effects such as  dry mouth.      Relevant Medications   oxybutynin (DITROPAN-XL) 5 MG 24 hr tablet   Depression    Currently on Prozac 40mg  daily. Reports increased stress due to recent death of mother and managing estate. Declines counseling due to time constraints. -Continue Prozac 40mg  daily.      Other Visit Diagnoses     Skin lesion    -  Primary   Relevant Orders   Ambulatory referral to Dermatology       I am having [facility] start on oxybutynin. I am also having her maintain her folic acid, vitamin A, zinc gluconate, Biotin, magnesium gluconate, ascorbic acid, Turmeric, Shingrix, alendronate, Caltrate 600+D Plus Minerals, and FLUoxetine.  Meds ordered this encounter  Medications   oxybutynin (DITROPAN-XL) 5 MG 24 hr tablet    Sig: Take 1 tablet (5 mg total) by mouth at bedtime.    Dispense:  90 tablet    Refill:  1    Order Specific Question:   Supervising Provider    Answer:   Danise Edge A [[id number]]

## 2022-11-17 NOTE — Telephone Encounter (Signed)
Opened in error

## 2022-11-17 NOTE — Assessment & Plan Note (Signed)
Currently on Prozac 40mg  daily. Reports increased stress due to recent death of mother and managing estate. Declines counseling due to time constraints. -Continue Prozac 40mg  daily.

## 2022-12-21 ENCOUNTER — Encounter: Payer: Self-pay | Admitting: Dermatology

## 2022-12-21 ENCOUNTER — Ambulatory Visit (INDEPENDENT_AMBULATORY_CARE_PROVIDER_SITE_OTHER): Payer: Medicare Other | Admitting: Dermatology

## 2022-12-21 VITALS — BP 116/77 | HR 77

## 2022-12-21 DIAGNOSIS — L814 Other melanin hyperpigmentation: Secondary | ICD-10-CM

## 2022-12-21 DIAGNOSIS — W908XXA Exposure to other nonionizing radiation, initial encounter: Secondary | ICD-10-CM | POA: Diagnosis not present

## 2022-12-21 DIAGNOSIS — L820 Inflamed seborrheic keratosis: Secondary | ICD-10-CM

## 2022-12-21 DIAGNOSIS — L821 Other seborrheic keratosis: Secondary | ICD-10-CM

## 2022-12-21 DIAGNOSIS — L578 Other skin changes due to chronic exposure to nonionizing radiation: Secondary | ICD-10-CM | POA: Diagnosis not present

## 2022-12-21 MED ORDER — HYDROQUINONE 4 % EX CREA: TOPICAL_CREAM | CUTANEOUS | 2 refills | Status: DC

## 2022-12-21 NOTE — Patient Instructions (Addendum)
Thank you for visiting my office today. I appreciate your commitment to addressing your skin concerns and am pleased to assist you in improving your skin health.  Here are the key instructions and recommendations from today's consultation:  - Sun Protection: Continue using SPF 70 sunscreen daily. Reapply throughout the day for effective protection. Additionally, consider using a powder sunscreen by [employer] for midday touch-ups.  - Hydroquinone Treatment: Start using Hydroquinone cream on your face at night for three months. After this period, take a break and switch to a milder, over-the-counter product for maintenance. Alternate between Hydroquinone and the over-the-counter product every three months to minimize side effects. Prescription details have been sent to your pharmacy.  - Vitamin C Serum: Apply Vitamin C serum every morning. This will work synergistically with Hydroquinone to enhance skin lightening effects and provide additional protection during the day.  - Treatment for Seborrheic Keratoses: If desired, we can proceed with freezing the seborrheic keratoses using liquid nitrogen. This treatment might leave a white spot, but efforts will be made to minimize this.  - Follow-Up: We will monitor the progress of the treated areas and the effectiveness of the Hydroquinone treatment. Please schedule a follow-up appointment to assess improvements and make any necessary adjustments to your treatment plan.  - Educational Materials: Information on the use of powder sunscreen and details about your prescribed treatments have been included in your after-visit summary.  Thank you once again for entrusting your dermatological care to me. Should you have any questions or require further assistance, please do not hesitate to contact our office.       Recommend [employer] powdered sunscreen for reapplication during the day.   Due to recent changes in healthcare laws, you may see results  of your pathology and/or laboratory studies on MyChart before the doctors have had a chance to review them. We understand that in some cases there may be results that are confusing or concerning to you. Please understand that not all results are received at the same time and often the doctors may need to interpret multiple results in order to provide you with the best plan of care or course of treatment. Therefore, we ask that you please give [place] 2 business days to thoroughly review all your results before contacting the office for clarification. Should we see a critical lab result, you will be contacted sooner.   If You Need Anything After Your Visit  If you have any questions or concerns for your doctor, please call our main line at [phone] If no one answers, please leave a voicemail as directed and we will return your call as soon as possible. Messages left after 4 pm will be answered the following business day.   You may also send [place] a message via MyChart. We typically respond to MyChart messages within 1-2 business days.  For prescription refills, please ask your pharmacy to contact our office. Our fax number is [phone].  If you have an urgent issue when the clinic is closed that cannot wait until the next business day, you can page your doctor at the number below.    Please note that while we do our best to be available for urgent issues outside of office hours, we are not available 24/7.   If you have an urgent issue and are unable to reach [place], you may choose to seek medical care at your doctor's office, retail clinic, urgent care center, or emergency room.  If you have a medical  emergency, please immediately call 911 or go to the emergency department. In the event of inclement weather, please call our main line at [phone] for an update on the status of any delays or closures.  Dermatology Medication Tips: Please keep the boxes that topical medications come in in order to  help keep track of the instructions about where and how to use these. Pharmacies typically print the medication instructions only on the boxes and not directly on the medication tubes.   If your medication is too expensive, please contact our office at [phone] or send [place] a message through MyChart.   We are unable to tell what your co-pay for medications will be in advance as this is different depending on your insurance coverage. However, we may be able to find a substitute medication at lower cost or fill out paperwork to get insurance to cover a needed medication.   If a prior authorization is required to get your medication covered by your insurance company, please allow [place] 1-2 business days to complete this process.  Drug prices often vary depending on where the prescription is filled and some pharmacies may offer cheaper prices.  The website www.goodrx.com contains coupons for medications through different pharmacies. The prices here do not account for what the cost may be with help from insurance (it may be cheaper with your insurance), but the website can give you the price if you did not use any insurance.  - You can print the associated coupon and take it with your prescription to the pharmacy.  - You may also stop by our office during regular business hours and pick up a GoodRx coupon card.  - If you need your prescription sent electronically to a different pharmacy, notify our office through [facility] or by phone at [phone]

## 2022-12-21 NOTE — Progress Notes (Signed)
   New Patient Visit   Subjective  Michelle Villegas is a 72 y.o. female who presents for the following: Irregular skin lesions on the face that have been there for years, but recently started turning darker, and an irregular skin lesion on the back that her daughter in law noticed. The patient has spots, moles and lesions to be evaluated, some may be new or changing and the patient may have concern these could be cancer.   Patient accompanied by husband who contributes to history.   The following portions of the chart were reviewed this encounter and updated as appropriate: medications, allergies, medical history  Review of Systems:  No other skin or systemic complaints except as noted in HPI or Assessment and Plan.  Objective  Well appearing patient in no apparent distress; mood and affect are within normal limits.   A focused examination was performed of the following areas:   Relevant exam findings are noted in the Assessment and Plan.  L cheek x 2, R mid to lat back x 1 Erythematous stuck-on, waxy papule or plaque.1.0 cm brown stuck on waxy papule of the L cheek.     Assessment & Plan   LENTIGINES - Face Exam: Brown macules of the face Due to sun exposure Treatment Plan: Benign-appearing, observe. Recommend daily broad spectrum sunscreen SPF 30+ to sun-exposed areas, reapply every 2 hours as needed.  Call for any changes  Start Hydroquinone 4% cream QHS x 3 mths then stop. Recommend [employer] powdered sunscreen for reapplication of sunscreen during the day. Recommend OTC vitamin C creams/serums in conjunction with Hydroquinone 4% cream.          SEBORRHEIC KERATOSIS - Face, back - Stuck-on, waxy, tan-brown papules and/or plaques  - Benign-appearing - Discussed benign etiology and prognosis. - Observe - Call for any changes  ACTINIC DAMAGE - chronic, secondary to cumulative UV radiation exposure/sun exposure over time - diffuse scaly erythematous macules  with underlying dyspigmentation - Recommend daily broad spectrum sunscreen SPF 30+ to sun-exposed areas, reapply every 2 hours as needed.  - Recommend staying in the shade or wearing long sleeves, sun glasses (UVA+UVB protection) and wide brim hats (4-inch brim around the entire circumference of the hat). - Call for new or changing lesions.  Inflamed seborrheic keratosis L cheek x 2, R mid to lat back x 1  Symptomatic, irritating, patient would like treated.   Destruction of lesion - L cheek x 2, R mid to lat back x 1 Complexity: simple   Destruction method: cryotherapy   Informed consent: discussed and consent obtained   Timeout:  patient name, date of birth, surgical site, and procedure verified Lesion destroyed using liquid nitrogen: Yes   Region frozen until ice ball extended beyond lesion: Yes   Outcome: patient tolerated procedure well with no complications   Post-procedure details: wound care instructions given     Return in about 3 months (around 03/23/2023) for lentigines follow up on the face.  Maylene Roes, CMA, am acting as scribe for [employer], DO .  Documentation: I have reviewed the above documentation for accuracy and completeness, and I agree with the above.  Langston Reusing, DO

## 2022-12-30 IMAGING — DX DG CHEST 1V PORT
1 series · 1 of 1 positions shown · non-contrast
Comparison: Radiograph October 27, 2013

CLINICAL DATA: One week of productive cough with brown sputum.
COVID positive.

EXAM:
PORTABLE CHEST 1 VIEW

[chest ap]
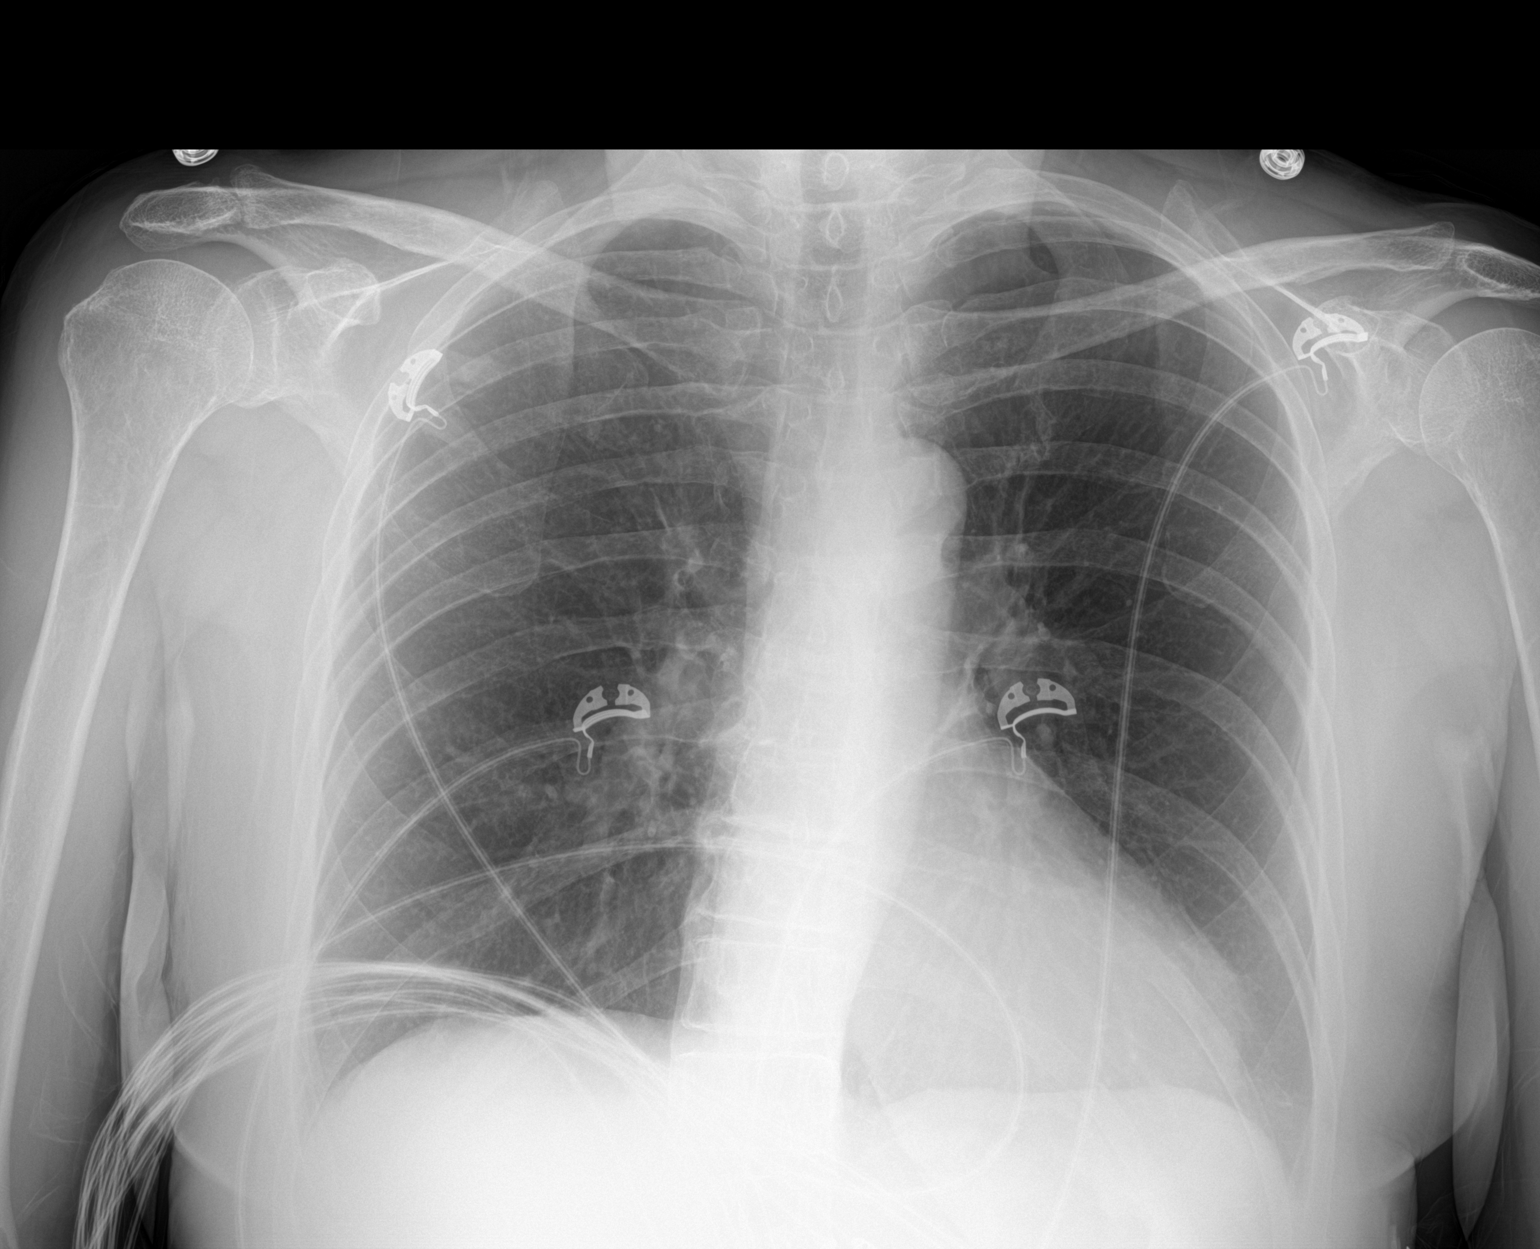

[1 of 1 positions shown; findings below may reference images not displayed]

FINDINGS: The heart size and mediastinal contours are within normal limits. No
focal airspace consolidation. No visible pleural effusion or
pneumothorax. The visualized skeletal structures are unremarkable.
IMPRESSION: No acute cardiopulmonary findings.

## 2023-02-07 ENCOUNTER — Other Ambulatory Visit: Payer: Self-pay | Admitting: Family

## 2023-03-03 ENCOUNTER — Other Ambulatory Visit: Payer: Self-pay | Admitting: Family

## 2023-03-23 ENCOUNTER — Ambulatory Visit: Payer: Medicare Other | Admitting: Dermatology

## 2023-05-04 ENCOUNTER — Encounter: Payer: Self-pay | Admitting: Dermatology

## 2023-05-04 ENCOUNTER — Ambulatory Visit: Payer: Medicare Other | Admitting: Dermatology

## 2023-05-04 VITALS — BP 121/80 | HR 79

## 2023-05-04 DIAGNOSIS — L814 Other melanin hyperpigmentation: Secondary | ICD-10-CM

## 2023-05-04 DIAGNOSIS — L820 Inflamed seborrheic keratosis: Secondary | ICD-10-CM | POA: Diagnosis not present

## 2023-05-04 MED ORDER — SAFETY SEAL MISCELLANEOUS MISC
1 refills | Status: AC
Start: 2023-05-04 — End: ?

## 2023-05-04 MED ORDER — SAFETY SEAL MISCELLANEOUS MISC
1 refills | Status: DC
Start: 2023-05-04 — End: 2023-05-04

## 2023-05-04 NOTE — Progress Notes (Signed)
   Follow-Up Visit   Subjective  Chardee Kicinski is a 72 y.o. female who presents for the following: lentigines & SK  Patient present today for follow up visit for lentigines & SK. Patient was last evaluated on 12/21/22. For the lentigines she was Rx Hydroquinone 4% cream to use every night at bedtime for 49mo then stop. Patient reports sxs are better but the pigmentation is still visible. Patient denies medication changes.  The following portions of the chart were reviewed this encounter and updated as appropriate: medications, allergies, medical history  Review of Systems:  No other skin or systemic complaints except as noted in HPI or Assessment and Plan.  Objective  Well appearing patient in no apparent distress; mood and affect are within normal limits.   A focused examination was performed of the following areas:   Relevant exam findings are noted in the Assessment and Plan.  Chest - Medial [facility]), Left Lower Back (2), Right Lower Back .SEBORRHEICKERATOSES           Assessment & Plan   VISIT SUMMARY Sathvika reports improvement in her dark spots after using 4% hydroquinone for three months and has been diligent with sunscreen application. She stopped hydroquinone in October to avoid potential skin deposition. She is experiencing irritation from seborrheic keratoses on her back, previously treated with liquid nitrogen, and has a new spot on her front. The plan includes starting a compound with tranexamic acid and tretinoin, continuing sunscreen use, and treating the remaining seborrheic keratoses with liquid nitrogen.    1. Lentigines - Assessment: Patient has been using 4% hydroquinone for three months with some improvement in dark spots. Treatment was stopped in October after completing the recommended three-month course. - Plan:   - Discontinue hydroquinone.   - Start compound with tranexamic acid and tretinoin from [facility] pharmacy.   - Apply compound every other  night.   - Use moisturizer after application.   - Continue daily sunscreen use.   - Follow up in 4-5 months.  2. Inflamed Seborrheic Keratoses - Assessment: Patient has three partially treated seborrheic keratoses on the back, one of which is irritated by bra. An additional warty lesion was noted on the front. - Plan:   - Liquid nitrogen cryotherapy to remaining lesions.   - Apply Aquaphor to treated areas.   - Consider Aquaphor spray for easier application.   - Arrange for assistance at home for application if needed.    Inflamed seborrheic keratosis (4) Chest - Medial [facility]); Left Lower Back (2); Right Lower Back  Destruction of lesion - Chest - Medial (Center), Left Lower Back (2), Right Lower Back Complexity: simple   Destruction method: cryotherapy   Informed consent: discussed and consent obtained   Timeout:  patient name, date of birth, surgical site, and procedure verified Lesion destroyed using liquid nitrogen: Yes   Region frozen until ice ball extended beyond lesion: Yes   Outcome: patient tolerated procedure well with no complications   Post-procedure details: wound care instructions given      No follow-ups on file.    Documentation: I have reviewed the above documentation for accuracy and completeness, and I agree with the above.   I, Shirron Marcha Solders, CMA, am acting as scribe for [employer], DO.   Langston Reusing, DO

## 2023-05-04 NOTE — Patient Instructions (Addendum)
Hello Leonila,  Thank you for visiting my office today. Your dedication to enhancing your skin health is greatly appreciated. Below is a summary of the essential instructions from our appointment today:  - Medications and Treatments:   - Tranexamic Acid: Continue with tranexamic acid obtained from [facility] during your breaks from hydroquinone. This medication will be mailed to you following address verification and payment.   - Tretinoin Application: Use tretinoin every other night, in combination with tranexamic acid, to prevent over-drying of your skin.     - Moisturizer: After applying tretinoin and tranexamic acid, apply a generous amount of a moisturizer that does not contain sunscreen for nighttime use.   - Seborrheic Keratoses: For areas irritated by seborrheic keratoses, generously apply Aquaphor to facilitate healing.  - Lifestyle Adjustments:   - Sunscreen: It is crucial to consistently use sunscreen during the day to protect your skin from sun damage.   - Hydroquinone Regimen: Adhere to the regimen of using hydroquinone for three months on, followed by three months off, to avoid overuse.  - Follow-Up:   - We will schedule a follow-up appointment in four to five months to evaluate your progress and potentially adjust the dosage of tretinoin as needed.  Please do not hesitate to reach out if you have any questions or concerns regarding your treatment plan.  Warm regards,  Dr. [facility] Re[facility]    Cryotherapy Aftercare  Wash gently with soap and water everyday.   Apply Vaseline and Band-Aid daily until healed.    Important Information  Due to recent changes in healthcare laws, you may see results of your pathology and/or laboratory studies on MyChart before the doctors have had a chance to review them. We understand that in some cases there may be results that are confusing or concerning to you. Please understand that not all results are received at the  same time and often the doctors may need to interpret multiple results in order to provide you with the best plan of care or course of treatment. Therefore, we ask that you please give [place] 2 business days to thoroughly review all your results before contacting the office for clarification. Should we see a critical lab result, you will be contacted sooner.   If You Need Anything After Your Visit  If you have any questions or concerns for your doctor, please call our main line at [phone] If no one answers, please leave a voicemail as directed and we will return your call as soon as possible. Messages left after 4 pm will be answered the following business day.   You may also send [place] a message via MyChart. We typically respond to MyChart messages within 1-2 business days.  For prescription refills, please ask your pharmacy to contact our office. Our fax number is [phone].  If you have an urgent issue when the clinic is closed that cannot wait until the next business day, you can page your doctor at the number below.    Please note that while we do our best to be available for urgent issues outside of office hours, we are not available 24/7.   If you have an urgent issue and are unable to reach [place], you may choose to seek medical care at your doctor's office, retail clinic, urgent care center, or emergency room.  If you have a medical emergency, please immediately call 911 or go to the emergency department. In the event of inclement weather, please call our main line at [phone]  for an update on the status of any delays or closures.  Dermatology Medication Tips: Please keep the boxes that topical medications come in in order to help keep track of the instructions about where and how to use these. Pharmacies typically print the medication instructions only on the boxes and not directly on the medication tubes.   If your medication is too expensive, please contact our office at  [phone] or send [place] a message through MyChart.   We are unable to tell what your co-pay for medications will be in advance as this is different depending on your insurance coverage. However, we may be able to find a substitute medication at lower cost or fill out paperwork to get insurance to cover a needed medication.   If a prior authorization is required to get your medication covered by your insurance company, please allow [place] 1-2 business days to complete this process.  Drug prices often vary depending on where the prescription is filled and some pharmacies may offer cheaper prices.  The website www.goodrx.com contains coupons for medications through different pharmacies. The prices here do not account for what the cost may be with help from insurance (it may be cheaper with your insurance), but the website can give you the price if you did not use any insurance.  - You can print the associated coupon and take it with your prescription to the pharmacy.  - You may also stop by our office during regular business hours and pick up a GoodRx coupon card.  - If you need your prescription sent electronically to a different pharmacy, notify our office through [facility] or by phone at [phone]

## 2023-05-14 ENCOUNTER — Other Ambulatory Visit: Payer: Self-pay | Admitting: Family

## 2023-05-14 DIAGNOSIS — R32 Unspecified urinary incontinence: Secondary | ICD-10-CM

## 2023-05-30 ENCOUNTER — Other Ambulatory Visit: Payer: Self-pay | Admitting: Family

## 2023-05-31 ENCOUNTER — Telehealth: Payer: Self-pay | Admitting: Family

## 2023-05-31 NOTE — Telephone Encounter (Signed)
Copied from CRM [id number]. Topic: Medicare AWV >> May 31, 2023  9:12 AM Payton Doughty wrote: Reason for CRM: LVM 05/31/23 to r/s AWV due to office closed early. New AWV date 06/08/23 at 1:50pm. Please confirm appt date change [facility]  Verlee Rossetti; Care Guide Ambulatory Clinical Support [place] [facility] Direct Dial: [phone]

## 2023-06-07 ENCOUNTER — Ambulatory Visit: Payer: Medicare Other | Admitting: Family

## 2023-06-08 ENCOUNTER — Ambulatory Visit: Payer: Medicare Other

## 2023-06-08 VITALS — Ht 65.5 in | Wt 153.0 lb

## 2023-06-08 DIAGNOSIS — Z0000 Encounter for general adult medical examination without abnormal findings: Secondary | ICD-10-CM | POA: Diagnosis not present

## 2023-06-08 NOTE — Progress Notes (Signed)
 Subjective:   Michelle Villegas is a 73 y.o. female who presents for Medicare Annual (Subsequent) preventive examination.  Visit Complete: Virtual I connected with  Michelle Villegas on 06/08/23 by a audio enabled telemedicine application and verified that I am speaking with the correct person using two identifiers.  Patient Location: Home  Provider Location: Home Office  I discussed the limitations of evaluation and management by telemedicine. The patient expressed understanding and agreed to proceed.  Vital Signs: Because this visit was a virtual/telehealth visit, some criteria may be missing or patient reported. Any vitals not documented were not able to be obtained and vitals that have been documented are patient reported.     Cardiac Risk Factors include: advanced age (>73men, >80 women)     Objective:    Today's Vitals   06/08/23 1355  Weight: 153 lb (69.4 kg)  Height: 5' 5.5 (1.664 m)   Body mass index is 25.07 kg/m.     06/08/2023    2:01 PM 05/20/2022    1:21 PM 10/23/2021   10:27 AM  Advanced Directives  Does Patient Have a Medical Advance Directive? Yes Yes Yes  Type of Estate Agent of [place];Living will Healthcare Power of [place];Living will Healthcare Power of [place];Living will  Copy of Healthcare Power of Attorney in Chart? No - copy requested No - copy requested     Current Medications (verified) Outpatient Encounter Medications as of 06/08/2023  Medication Sig   alendronate  (FOSAMAX ) 70 MG tablet TAKE 1 TABLET (70 MG TOTAL) BY MOUTH EVERY 7 DAYS WITH FULL GLASS WATER ON EMPTY STOMACH   Beta Carotene (VITAMIN A) 25000 UNIT capsule Take 25,000 Units by mouth daily.   Biotin 5000 MCG TABS Take by mouth.   Calcium Carbonate-Vit D-Min (CALTRATE 600+D PLUS MINERALS) 600-800 MG-UNIT TABS Take 1 tablet by mouth 2 (two) times daily.   FLUoxetine  (PROZAC ) 40 MG capsule TAKE 1 CAPSULE (40 MG TOTAL) BY MOUTH DAILY.   folic acid (FOLVITE)  800 MCG tablet Take by mouth daily.   hydroquinone  4 % cream Apply to face QHS x 3 months then stop.   magnesium gluconate (MAGONATE) 500 MG tablet Take 500 mg by mouth 2 (two) times daily.   oxybutynin  (DITROPAN -XL) 5 MG 24 hr tablet TAKE 1 TABLET BY MOUTH EVERYDAY AT BEDTIME   Safety Seal Miscellaneous MISC Medication name: Melaxemic Cream with Tranexamic Acid 5% Kojic Acid USP 2% Vit C USP 2.5% Tretinoin  USP 0.025% Hyaluronic Acid EXCP 0.1%   Turmeric (QC TUMERIC COMPLEX) 500 MG CAPS Take by mouth.   vitamin C (ASCORBIC ACID) 500 MG tablet Take 500 mg by mouth daily.   zinc gluconate 50 MG tablet Take 50 mg by mouth daily.   Zoster Vaccine Adjuvanted (SHINGRIX ) injection 50 mcg IM now and again in 2-6 months   No facility-administered encounter medications on file as of 06/08/2023.    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   H/O: cesarean section 1984   Heart murmur    History of tonsillectomy    Osteoporosis    Tubal pregnancy 1984   Past Surgical History:  Procedure Laterality Date   CESAREAN SECTION     X 2   ECTOPIC PREGNANCY SURGERY     KIDNEY DONATION Left 2001   TONSILLECTOMY     Family History  Problem Relation Age of  Onset   Hyperlipidemia Mother    Depression Mother    Breast cancer Mother        in her3's   Cancer Mother    Cancer - Cervical Mother        angiosarcoma   COPD Father    Diabetes Brother    Hearing loss Brother    Kidney disease Brother    Diabetes Maternal Grandmother    Arthritis Maternal Grandfather    Diabetes Paternal Grandmother    Colon cancer Neg Hx    Colon polyps Neg Hx    Esophageal cancer Neg Hx    Rectal cancer Neg Hx    Stomach cancer Neg Hx    Social History   Socioeconomic History   Marital status: Married    Spouse name: Not on file   Number of children: Not on file   Years of  education: Not on file   Highest education level: Not on file  Occupational History   Not on file  Tobacco Use   Smoking status: Never   Smokeless tobacco: Not on file  Vaping Use   Vaping status: Never Used  Substance and Sexual Activity   Alcohol use: No   Drug use: No   Sexual activity: Yes    Partners: Male  Other Topics Concern   Not on file  Social History Narrative   Retired teacher, early years/pre    2 grown sons, one in [place] and one in [place], granddaughter in [place]   Enjoys [place], gym, visiting family, and beach house   Has a Yorkie   Social Drivers of Health   Financial Resource Strain: Low Risk  (06/08/2023)   Overall Financial Resource Strain (CARDIA)    Difficulty of Paying Living Expenses: Not hard at all  Food Insecurity: No Food Insecurity (06/08/2023)   Hunger Vital Sign    Worried About Running Out of Food in the Last Year: Never true    Ran Out of Food in the Last Year: Never true  Transportation Needs: No Transportation Needs (06/08/2023)   PRAPARE - Administrator, Civil Service (Medical): No    Lack of Transportation (Non-Medical): No  Physical Activity: Sufficiently Active (06/08/2023)   Exercise Vital Sign    Days of Exercise per Week: 3 days    Minutes of Exercise per Session: 120 min  Stress: No Stress Concern Present (06/08/2023)   [employer] of Occupational Health - Occupational Stress Questionnaire    Feeling of Stress : Not at all  Social Connections: Socially Integrated (06/08/2023)   Social Connection and Isolation Panel [NHANES]    Frequency of Communication with Friends and Family: More than three times a week    Frequency of Social Gatherings with Friends and Family: More than three times a week    Attends Religious Services: More than 4 times per year    Active Member of [employer] or Organizations: Yes    Attends Engineer, Structural: More than 4 times per year    Marital Status: Married    Tobacco  Counseling Counseling given: Not Answered   Clinical Intake:  Pre-visit preparation completed: Yes  Pain : No/denies pain     BMI - recorded: 25.07 Nutritional Status: BMI 25 -29 Overweight Nutritional Risks: None Diabetes: No  How often do you need to have someone help you when you read instructions, pamphlets, or other written materials from your doctor or pharmacy?: 1 - Never  Interpreter Needed?: No  Information entered by ::  Rojelio Blush LPN   Activities of Daily Living    06/08/2023    2:00 PM  In your present state of health, do you have any difficulty performing the following activities:  Hearing? 0  Vision? 0  Difficulty concentrating or making decisions? 0  Walking or climbing stairs? 0  Dressing or bathing? 0  Doing errands, shopping? 0  Preparing Food and eating ? N  Using the Toilet? N  In the past six months, have you accidently leaked urine? N  Do you have problems with loss of bowel control? N  Managing your Medications? N  Managing your Finances? N  Housekeeping or managing your Housekeeping? N    Patient Care Team: Daryl Setter, NP as PCP - General (Internal Medicine)  Indicate any recent Medical Services you may have received from other than [facility] providers in the past year (date may be approximate).     Assessment:   This is a routine wellness examination for [facility].  Hearing/Vision screen Hearing Screening - Comments:: Denies hearing difficulties   Vision Screening - Comments:: Wears rx glasses - up to date with routine eye exams with  [facility]   Goals Addressed               This Visit's Progress     Stay Active (pt-stated)         Depression Screen    06/08/2023    1:59 PM 11/17/2022    8:51 AM 05/20/2022    1:18 PM 11/19/2021    1:36 PM  PHQ 2/9 Scores  PHQ - 2 Score 0 0 2 3  PHQ- 9 Score  6 2 10     Fall Risk    06/08/2023    2:01 PM 11/17/2022    8:52 AM  Fall Risk   Falls in the past year? 0 0   Number falls in past yr: 0 0  Injury with Fall? 0 0  Risk for fall due to : No Fall Risks No Fall Risks  Follow up Falls prevention discussed     MEDICARE RISK AT HOME: Medicare Risk at Home Any stairs in or around the home?: Yes If so, are there any without handrails?: No Home free of loose throw rugs in walkways, pet beds, electrical cords, etc?: Yes Adequate lighting in your home to reduce risk of falls?: Yes Life alert?: No Use of a cane, walker or w/c?: No Grab bars in the bathroom?: Yes Shower chair or bench in shower?: No Elevated toilet seat or a handicapped toilet?: No  TIMED UP AND GO:  Was the test performed?  No    Cognitive Function:        06/08/2023    2:01 PM 05/20/2022    1:23 PM  6CIT Screen  What Year? 0 points 0 points  What month? 0 points 0 points  What time? 0 points 0 points  Count back from 20 0 points 0 points  Months in reverse 0 points 0 points  Repeat phrase 0 points 0 points  Total Score 0 points 0 points    Immunizations Immunization History  Administered Date(s) Administered   Fluad Quad(high Dose 65+) 03/18/2022   Hepatitis A 07/19/2003, 01/23/2004   Influenza, High Dose Seasonal PF 03/24/2023   PFIZER SARS-COV-2 Pediatric Vaccination 5-17yrs 02/21/2021   PFIZER(Purple Top)SARS-COV-2 Vaccination 06/23/2019, 07/14/2019, 02/28/2020, 08/27/2020   PNEUMOCOCCAL CONJUGATE-20 11/19/2021   Pfizer(Comirnaty)Fall Seasonal Vaccine 12 years and older 03/12/2023   Pneumococcal Conjugate-13 09/24/2015   Respiratory Syncytial Virus  Vaccine,Recomb Aduvanted(Arexvy) 04/29/2022   Td 01/19/2016   Tdap 04/10/2018   Unspecified SARS-COV-2 Vaccination 03/23/2022   Zoster Recombinant(Shingrix ) 12/09/2021, 06/11/2022   Zoster, Live 08/23/2012    TDAP status: Up to date  Flu Vaccine status: Up to date  Pneumococcal vaccine status: Up to date  Covid-19 vaccine status: Declined, Education has been provided regarding the importance of this vaccine  but patient still declined. Advised may receive this vaccine at local pharmacy or Health Dept.or vaccine clinic. Aware to provide a copy of the vaccination record if obtained from local pharmacy or Health Dept. Verbalized acceptance and understanding.  Qualifies for Shingles Vaccine? Yes   Zostavax completed Yes   Shingrix  Completed?: Yes  Screening Tests Health Maintenance  Topic Date Due   Hepatitis C Screening  Never done   COVID-19 Vaccine (7 - 2024-25 season) 05/07/2023   MAMMOGRAM  12/09/2023   Medicare Annual Wellness (AWV)  06/07/2024   DTaP/Tdap/Td (3 - Td or Tdap) 04/10/2028   Pneumonia Vaccine 66+ Years old  Completed   INFLUENZA VACCINE  Completed   DEXA SCAN  Completed   Zoster Vaccines- Shingrix   Completed   HPV VACCINES  Aged Out   Colonoscopy  Discontinued    Health Maintenance  Health Maintenance Due  Topic Date Due   Hepatitis C Screening  Never done   COVID-19 Vaccine (7 - 2024-25 season) 05/07/2023      Mammogram status: Completed 12/08/21. Repeat every year  Bone Density status: Completed 12/08/21. Results reflect: Bone density results: OSTEOPENIA. Repeat every   years.     Additional Screening:  Hepatitis C Screening: does qualify; Completed Deferred  Vision Screening: Recommended annual ophthalmology exams for early detection of glaucoma and other disorders of the eye. Is the patient up to date with their annual eye exam?  Yes  Who is the provider or what is the name of the office in which the patient attends annual eye exams? [facility] If pt is not established with a provider, would they like to be referred to a provider to establish care? No .   Dental Screening: Recommended annual dental exams for proper oral hygiene    Community Resource Referral / Chronic Care Management:  CRR required this visit?  No   CCM required this visit?  No     Plan:     I have personally reviewed and noted the following in the patient's chart:    Medical and social history Use of alcohol, tobacco or illicit drugs  Current medications and supplements including opioid prescriptions. Patient is not currently taking opioid prescriptions. Functional ability and status Nutritional status Physical activity Advanced directives List of other physicians Hospitalizations, surgeries, and ER visits in previous 12 months Vitals Screenings to include cognitive, depression, and falls Referrals and appointments  In addition, I have reviewed and discussed with patient certain preventive protocols, quality metrics, and best practice recommendations. A written personalized care plan for preventive services as well as general preventive health recommendations were provided to patient.     Rojelio LELON Blush, LPN   12/31/7972   After Visit Summary: (MyChart) Due to this being a telephonic visit, the after visit summary with patients personalized plan was offered to patient via MyChart   Nurse Notes: None

## 2023-06-08 NOTE — Patient Instructions (Addendum)
 Michelle Villegas , Thank you for taking time to come for your Medicare Wellness Visit. I appreciate your ongoing commitment to your health goals. Please review the following plan we discussed and let me know if I can assist you in the future.   Referrals/Orders/Follow-Ups/Clinician Recommendations:   This is a list of the screening recommended for you and due dates:  Health Maintenance  Topic Date Due   Hepatitis C Screening  Never done   COVID-19 Vaccine (7 - 2024-25 season) 05/07/2023   Mammogram  12/09/2023   Medicare Annual Wellness Visit  06/07/2024   DTaP/Tdap/Td vaccine (3 - Td or Tdap) 04/10/2028   Pneumonia Vaccine  Completed   Flu Shot  Completed   DEXA scan (bone density measurement)  Completed   Zoster (Shingles) Vaccine  Completed   HPV Vaccine  Aged Out   Colon Cancer Screening  Discontinued    Advanced directives: (Copy Requested) Please bring a copy of your health care power of attorney and living will to the office to be added to your chart at your convenience.  Next Medicare Annual Wellness Visit scheduled for next year: Yes

## 2023-06-11 ENCOUNTER — Ambulatory Visit (INDEPENDENT_AMBULATORY_CARE_PROVIDER_SITE_OTHER): Payer: Medicare Other | Admitting: Family

## 2023-06-11 VITALS — BP 125/76 | HR 87 | Temp 97.7°F | Resp 16 | Ht 65.5 in | Wt 148.0 lb

## 2023-06-11 DIAGNOSIS — F32A Depression, unspecified: Secondary | ICD-10-CM

## 2023-06-11 DIAGNOSIS — R32 Unspecified urinary incontinence: Secondary | ICD-10-CM

## 2023-06-11 DIAGNOSIS — M810 Age-related osteoporosis without current pathological fracture: Secondary | ICD-10-CM

## 2023-06-11 NOTE — Assessment & Plan Note (Signed)
  On Fosamax, last bone density scan was on July 23rd. -Plan to repeat bone density scan in early August 2025. -continue calcium and fosamax.

## 2023-06-11 NOTE — Patient Instructions (Signed)
 VISIT SUMMARY:  You came in today for a follow-up visit after discontinuing Prozac  and Ditropan . You have successfully stopped taking both medications without any adverse effects. You are currently only taking vitamins and Fosamax  for osteoporosis. You are up to date with your vaccinations and have been actively working on your health by going to the gym regularly and losing weight.  YOUR PLAN:  -DEPRESSION: Depression is a mental health condition characterized by persistent feelings of sadness and loss of interest. You have successfully discontinued Prozac  since before Thanksgiving without any recurrence of symptoms. No further action is required at this time.  -URINARY INCONTINENCE: Urinary incontinence is the loss of bladder control. You have discontinued Ditropan  since December 1st without any recurrence of symptoms. We will remove Ditropan  from your medication list.  -OSTEOPOROSIS: Osteoporosis is a condition where bones become weak and brittle. You are currently taking Fosamax  and your last bone density scan was on July 23rd. We plan to repeat the bone density scan in early August 2025.  -GENERAL HEALTH MAINTENANCE: You have received your latest COVID booster and completed your annual wellness visit two days ago. Continue with your current health regimen and we will plan to meet back in a year unless any new issues arise.  INSTRUCTIONS:  We will plan to repeat your bone density scan in early August 2025. Please continue with your current health regimen and schedule a follow-up visit in a year unless any new issues arise.

## 2023-06-11 NOTE — Assessment & Plan Note (Signed)
  Discontinued Ditropan since December 1st without recurrence of symptoms. -Remove Ditropan from medication list. Monitor.

## 2023-06-11 NOTE — Assessment & Plan Note (Signed)
  Successfully discontinued Prozac since before Thanksgiving without recurrence of symptoms. Will continue to monitor off of Prozac.

## 2023-06-11 NOTE — Progress Notes (Signed)
 Subjective:     Patient ID: Michelle Villegas, female    DOB: December 06, 1950, 73 y.o.   MRN: [id number]  Chief Complaint  Patient presents with   Depression    Here for follow up, discontinued prozac      HPI  Discussed the use of AI scribe software for clinical note transcription with the patient, who gave verbal consent to proceed.  History of Present Illness   The patient, with a history of depression and overactive bladder, presents for a follow-up after discontinuing Prozac  and Ditropan . She decided to stop taking Prozac  after her mother's passing in February, which had been a significant source of stress. She gradually weaned off the medication, first reducing the dose and then taking it every other day, before stopping completely before Thanksgiving. She reports no adverse effects since discontinuing the medication.  While on holiday in December, she inadvertently left her Ditropan  at home. However, she did not experience any adverse effects in its absence and decided not to resume taking it. She has been off Ditropan  since December 1st.  The patient is currently only taking vitamins and Fosamax  for osteoporosis. She received her latest COVID booster and is up to date with her vaccinations. She has been working on losing weight and has seen some success. She is also active, going to the gym regularly.       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   H/O: cesarean section 1984   Heart murmur    History of tonsillectomy    Osteoporosis    Tubal pregnancy 1984    Past Surgical History:  Procedure Laterality Date   CESAREAN SECTION     X 2   ECTOPIC PREGNANCY SURGERY     KIDNEY DONATION Left 2001   TONSILLECTOMY      Family History  Problem Relation Age  of Onset   Hyperlipidemia Mother    Depression Mother    Breast cancer Mother        in her55's   Cancer Mother    Cancer - Cervical Mother        angiosarcoma   COPD Father    Diabetes Brother    Hearing loss Brother    Kidney disease Brother    Diabetes Maternal Grandmother    Arthritis Maternal Grandfather    Diabetes Paternal Grandmother    Colon cancer Neg Hx    Colon polyps Neg Hx    Esophageal cancer Neg Hx    Rectal cancer Neg Hx    Stomach cancer Neg Hx     Social History   Socioeconomic History   Marital status: Married    Spouse name: Not on file   Number of children: Not on file   Years of education: Not on file   Highest education level: Not on file  Occupational History   Not on file  Tobacco Use   Smoking status: Never   Smokeless tobacco: Not on file  Vaping Use   Vaping status: Never Used  Substance and Sexual Activity   Alcohol use: No   Drug use: No   Sexual activity: Yes    Partners: Male  Other Topics Concern   Not on file  Social History Narrative  Retired teacher, early years/pre    2 grown sons, one in [place] and one in [place], granddaughter in [place]   Enjoys [place], gym, visiting family, and beach house   Has a Yorkie   Social Drivers of Health   Financial Resource Strain: Low Risk  (06/08/2023)   Overall Financial Resource Strain (CARDIA)    Difficulty of Paying Living Expenses: Not hard at all  Food Insecurity: No Food Insecurity (06/08/2023)   Hunger Vital Sign    Worried About Running Out of Food in the Last Year: Never true    Ran Out of Food in the Last Year: Never true  Transportation Needs: No Transportation Needs (06/08/2023)   PRAPARE - Administrator, Civil Service (Medical): No    Lack of Transportation (Non-Medical): No  Physical Activity: Sufficiently Active (06/08/2023)   Exercise Vital Sign    Days of Exercise per Week: 3 days    Minutes of Exercise per Session: 120 min  Stress: No Stress Concern Present  (06/08/2023)   [employer] of Occupational Health - Occupational Stress Questionnaire    Feeling of Stress : Not at all  Social Connections: Socially Integrated (06/08/2023)   Social Connection and Isolation Panel [NHANES]    Frequency of Communication with Friends and Family: More than three times a week    Frequency of Social Gatherings with Friends and Family: More than three times a week    Attends Religious Services: More than 4 times per year    Active Member of [employer] or Organizations: Yes    Attends Engineer, Structural: More than 4 times per year    Marital Status: Married  Catering Manager Violence: Not At Risk (06/08/2023)   Humiliation, Afraid, Rape, and Kick questionnaire    Fear of Current or Ex-Partner: No    Emotionally Abused: No    Physically Abused: No    Sexually Abused: No    Outpatient Medications Prior to Visit  Medication Sig Dispense Refill   alendronate  (FOSAMAX ) 70 MG tablet TAKE 1 TABLET (70 MG TOTAL) BY MOUTH EVERY 7 DAYS WITH FULL GLASS WATER ON EMPTY STOMACH 12 tablet 4   Beta Carotene (VITAMIN A) 25000 UNIT capsule Take 25,000 Units by mouth daily.     Biotin 5000 MCG TABS Take by mouth.     Calcium Carbonate-Vit D-Min (CALTRATE 600+D PLUS MINERALS) 600-800 MG-UNIT TABS Take 1 tablet by mouth 2 (two) times daily.     folic acid (FOLVITE) 800 MCG tablet Take by mouth daily.     hydroquinone  4 % cream Apply to face QHS x 3 months then stop. 28.35 g 2   magnesium gluconate (MAGONATE) 500 MG tablet Take 500 mg by mouth 2 (two) times daily.     Safety Seal Miscellaneous MISC Medication name: Melaxemic Cream with Tranexamic Acid 5% Kojic Acid USP 2% Vit C USP 2.5% Tretinoin  USP 0.025% Hyaluronic Acid EXCP 0.1% 1 Application 1   Turmeric (QC TUMERIC COMPLEX) 500 MG CAPS Take by mouth.     vitamin C (ASCORBIC ACID) 500 MG tablet Take 500 mg by mouth daily.     zinc gluconate 50 MG tablet Take 50 mg by mouth daily.     oxybutynin  (DITROPAN -XL) 5 MG 24 hr  tablet TAKE 1 TABLET BY MOUTH EVERYDAY AT BEDTIME 90 tablet 1   Zoster Vaccine Adjuvanted (SHINGRIX ) injection 50 mcg IM now and again in 2-6 months 0.5 mL 1   FLUoxetine  (PROZAC ) 40 MG capsule TAKE 1 CAPSULE (40 MG  TOTAL) BY MOUTH DAILY. (Patient not taking: Reported on 06/11/2023) 90 capsule 0   No facility-administered medications prior to visit.    Allergies  Allergen Reactions   Penicillins     Unsure of reaction. Childhood allergy.    ROS See HPI    Objective:    Physical Exam Constitutional:      Appearance: She is well-developed.  Cardiovascular:     Rate and Rhythm: Normal rate and regular rhythm.     Heart sounds: Normal heart sounds. No murmur heard. Pulmonary:     Effort: Pulmonary effort is normal. No respiratory distress.     Breath sounds: Normal breath sounds. No wheezing.  Psychiatric:        Behavior: Behavior normal.        Thought Content: Thought content normal.        Judgment: Judgment normal.      BP 125/76 (BP Location: Right Arm, Patient Position: Sitting, Cuff Size: Small)   Pulse 87   Temp 97.7 F (36.5 C) (Oral)   Resp 16   Ht 5' 5.5 (1.664 m)   Wt 148 lb (67.1 kg)   SpO2 99%   BMI 24.25 kg/m  Wt Readings from Last 3 Encounters:  06/11/23 148 lb (67.1 kg)  06/08/23 153 lb (69.4 kg)  11/17/22 153 lb (69.4 kg)       Assessment & Plan:   Problem List Items Addressed This Visit       Unprioritized   Urinary incontinence - Primary    Discontinued Ditropan  since December 1st without recurrence of symptoms. -Remove Ditropan  from medication list. Monitor.      Osteoporosis    On Fosamax , last bone density scan was on July 23rd. -Plan to repeat bone density scan in early August 2025. -continue calcium and fosamax .      Depression    Successfully discontinued Prozac  since before Thanksgiving without recurrence of symptoms. Will continue to monitor off of Prozac .       I have discontinued [place]er Hynek's Shingrix ,  oxybutynin , and FLUoxetine . I am also having her maintain her folic acid, vitamin A, zinc gluconate, Biotin, magnesium gluconate, ascorbic acid, Turmeric, Caltrate 600+D Plus Minerals, hydroquinone , alendronate , and Safety Seal Miscellaneous.  No orders of the defined types were placed in this encounter.

## 2023-10-07 ENCOUNTER — Ambulatory Visit: Payer: Medicare Other | Admitting: Dermatology

## 2023-10-20 ENCOUNTER — Ambulatory Visit (INDEPENDENT_AMBULATORY_CARE_PROVIDER_SITE_OTHER): Admitting: Dermatology

## 2023-10-20 ENCOUNTER — Encounter: Payer: Self-pay | Admitting: Dermatology

## 2023-10-20 VITALS — BP 104/68

## 2023-10-20 DIAGNOSIS — L814 Other melanin hyperpigmentation: Secondary | ICD-10-CM

## 2023-10-20 MED ORDER — HYDROQUINONE 4 % EX CREA
TOPICAL_CREAM | CUTANEOUS | 2 refills | Status: AC
Start: 2023-10-20 — End: ?

## 2023-10-20 MED ORDER — TRETINOIN 0.05 % EX CREA
TOPICAL_CREAM | CUTANEOUS | 2 refills | Status: AC
Start: 2023-10-20 — End: ?

## 2023-10-20 NOTE — Progress Notes (Unsigned)
   Follow-Up Visit   Subjective  Michelle Villegas is a 73 y.o. female who presents for the following: Lentigines  Patient present today for follow up visit. Patient was last evaluated on 05/04/23. At this visit patient was prescribed Melaxamic Cream from [facility]. Patient reports sxs are better . Patient denies medication changes.  The following portions of the chart were reviewed this encounter and updated as appropriate: medications, allergies, medical history  Review of Systems:  No other skin or systemic complaints except as noted in HPI or Assessment and Plan.  Objective  Well appearing patient in no apparent distress; mood and affect are within normal limits.   A focused examination was performed of the following areas: face   Relevant exam findings are noted in the Assessment and Plan.          Assessment & Plan   LENTIGINES Exam: scattered tan macules Due to sun exposure Treatment Plan: Benign-appearing, observe. Recommend daily broad spectrum sunscreen SPF 30+ to sun-exposed areas, reapply every 2 hours as needed.  Call for any changes  - D/C Medrock Melaxemic cream. - Restart Hydroquinone  - apply to face on nights Tu/TH/Sat - Rx Tretinoin 0.05% - apply to the face 3 nights a week on MWF - Recommended Eucerin Radiant Tone Serum to apply prior to Tretinoin  - Mineral SPF use daily  Morning Routine: Wash face, Eucerin Radiant Tone, SPF  Night Routine MWF - Wash face, Eucerin Radiant Tone, Tretinoin, Moisturizer Tu/Th/Sat - Wash face, Eucerin Radiant Tine, Hydroquinone , moisturizer   LENTIGINES   Related Medications Safety Seal Miscellaneous MISC Medication name: Melaxemic Cream with Tranexamic Acid 5% Kojic Acid USP 2% Vit C USP 2.5% Tretinoin USP 0.025% Hyaluronic Acid EXCP 0.1% tretinoin (RETIN-A) 0.05 % cream apply to the face 3 nights a week on MWF hydroquinone  4 % cream Apply to the face at night on Tu, Th, Sat. Stop in 10mo.  No follow-ups on  file.   Documentation: I have reviewed the above documentation for accuracy and completeness, and I agree with the above.   I, Shirron Louanne Roussel, CMA, am acting as scribe for [employer], DO.    Louana Roup, DO

## 2023-10-20 NOTE — Patient Instructions (Addendum)
 Hello Michelle Villegas,  Thank you for visiting today. Here is a summary of the key instructions:  - Skin Care Routine:   - For the next 2 weeks:     - Do not use any active ingredients     - Apply Aquaphor and sunscreen to treated areas during the day    - After 2 weeks, start this routine:     - Morning:       - Wash face       - Apply Eucerin Radiant Tone serum       - Apply sunscreen      - Evening:       - Wash face       - Apply Eucerin Radiant Tone serum       - On Monday, Wednesday, Friday:         - Apply tretinoin 0.05%         - Follow with moisturizer if needed       - On Tuesday, Thursday, Saturday:         - Apply hydroquinone        - Sunday: Rest day (no treatment)  - Medications:   - Tretinoin 0.05%: Use 3 nights a week for 2 weeks, then increase to 5 nights if tolerated   - Hydroquinone : Apply on alternating nights from tretinoin  - Sun Protection:   - Use mineral sunscreen daily   - Reapply sunscreen throughout the day   - Use ColorScience powder sunscreen for easy reapplication  - Follow-up:   - Return in 4 months for a check-up   - Take a break from hydroquinone  after 4 months of use  - Travel Precautions:   - If traveling to very sunny areas near the equator:     - Take a break from tretinoin and hydroquinone   We look forward to seeing you at your next visit. If you have any questions or concerns before then, please do not hesitate to contact our office.  Warm regards,  Dr. Louana Roup [facility]at[facility]   Important Information  Due to recent changes in healthcare laws, you may see results of your pathology and/or laboratory studies on MyChart before the doctors have had a chance to review them. We understand that in some cases there may be results that are confusing or concerning to you. Please understand that not all results are received at the same time and often the doctors may need to interpret multiple results in order to provide you with the best  plan of care or course of treatment. Therefore, we ask that you please give us  2 business days to thoroughly review all your results before contacting the office for clarification. Should we see a critical lab result, you will be contacted sooner.   If You Need Anything After Your Visit  If you have any questions or concerns for your doctor, please call our main line at [phone] If no one answers, please leave a voicemail as directed and we will return your call as soon as possible. Messages left after 4 pm will be answered the following business day.   You may also send us  a message via MyChart. We typically respond to MyChart messages within 1-2 business days.  For prescription refills, please ask your pharmacy to contact our office. Our fax number is [phone].  If you have an urgent issue when the clinic is closed that cannot wait until the next business day, you can page your doctor at the  number below.    Please note that while we do our best to be available for urgent issues outside of office hours, we are not available 24/7.   If you have an urgent issue and are unable to reach us , you may choose to seek medical care at your doctor's office, retail clinic, urgent care center, or emergency room.  If you have a medical emergency, please immediately call 911 or go to the emergency department. In the event of inclement weather, please call our main line at [phone] for an update on the status of any delays or closures.  Dermatology Medication Tips: Please keep the boxes that topical medications come in in order to help keep track of the instructions about where and how to use these. Pharmacies typically print the medication instructions only on the boxes and not directly on the medication tubes.   If your medication is too expensive, please contact our office at [phone] or send us  a message through MyChart.   We are unable to tell what your co-pay for medications will be  in advance as this is different depending on your insurance coverage. However, we may be able to find a substitute medication at lower cost or fill out paperwork to get insurance to cover a needed medication.   If a prior authorization is required to get your medication covered by your insurance company, please allow us  1-2 business days to complete this process.  Drug prices often vary depending on where the prescription is filled and some pharmacies may offer cheaper prices.  The website www.goodrx.com contains coupons for medications through different pharmacies. The prices here do not account for what the cost may be with help from insurance (it may be cheaper with your insurance), but the website can give you the price if you did not use any insurance.  - You can print the associated coupon and take it with your prescription to the pharmacy.  - You may also stop by our office during regular business hours and pick up a GoodRx coupon card.  - If you need your prescription sent electronically to a different pharmacy, notify our office through [facility] or by phone at [phone]

## 2023-12-31 ENCOUNTER — Other Ambulatory Visit: Payer: Self-pay | Admitting: Family

## 2023-12-31 DIAGNOSIS — Z780 Asymptomatic menopausal state: Secondary | ICD-10-CM

## 2024-02-07 ENCOUNTER — Ambulatory Visit (HOSPITAL_BASED_OUTPATIENT_CLINIC_OR_DEPARTMENT_OTHER)
Admission: RE | Admit: 2024-02-07 | Discharge: 2024-02-07 | Disposition: A | Discharge status: Home / Self Care | Source: Ambulatory Visit | Attending: Family | Admitting: Family

## 2024-02-07 DIAGNOSIS — Z780 Asymptomatic menopausal state: Secondary | ICD-10-CM | POA: Insufficient documentation

## 2024-02-09 ENCOUNTER — Ambulatory Visit: Payer: Self-pay | Admitting: Family

## 2024-03-06 ENCOUNTER — Encounter: Payer: Self-pay | Admitting: Dermatology

## 2024-03-06 ENCOUNTER — Ambulatory Visit: Admitting: Dermatology

## 2024-03-06 VITALS — BP 101/69

## 2024-03-06 DIAGNOSIS — B078 Other viral warts: Secondary | ICD-10-CM | POA: Diagnosis not present

## 2024-03-06 DIAGNOSIS — L820 Inflamed seborrheic keratosis: Secondary | ICD-10-CM | POA: Diagnosis not present

## 2024-03-06 DIAGNOSIS — L814 Other melanin hyperpigmentation: Secondary | ICD-10-CM

## 2024-03-06 MED ORDER — SAFETY SEAL MISCELLANEOUS MISC: 1.0000 | Freq: Every evening | 2 refills | Status: AC

## 2024-03-06 NOTE — Progress Notes (Signed)
   Follow-Up Visit   Subjective  Michelle Villegas is a 73 y.o. female who presents for the following: Lentigines follow up - She used Hydroquinone  4% cream 3 days per week up until August. She is currently using tretinoin  0.05% cream 3-4 nights per week and Eucerin Radiant Tone Dual Serum. She would like to discuss increasing strength of tretinoin  and possibly restarting the hydroquinone .   The following portions of the chart were reviewed this encounter and updated as appropriate: medications, allergies, medical history  Review of Systems:  No other skin or systemic complaints except as noted in HPI or Assessment and Plan.  Objective  Well appearing patient in no apparent distress; mood and affect are within normal limits.   A focused examination was performed of the following areas: Face  Relevant exam findings are noted in the Assessment and Plan.  Left forehead (3) Erythematous stuck-on, waxy papule or plaque Lateral to right lat canthus Fliaform verrucous papule  Assessment & Plan   LENTIGINES Exam: scattered tan macules of face Due to sun exposure  Treatment Plan: Improving Benign-appearing, observe. Recommend daily broad spectrum sunscreen SPF 30+ to sun-exposed areas, reapply every 2 hours as needed.  Call for any changes  In November, restart Hydroquinone  6%/Kojic acid cream nightly for 4 months.  Continue Eucerin Radiant Tone Dual Serum daily and tretinoin  0.05% cream 3-4 nights per week.  INFLAMED SEBORRHEIC KERATOSIS (3) Left forehead (3) Symptomatic, irritating, patient would like treated.  Benign-appearing.  Call clinic for new or changing lesions.   Destruction of lesion - Left forehead (3) Complexity: simple   Destruction method: cryotherapy   Informed consent: discussed and consent obtained   Timeout:  patient name, date of birth, surgical site, and procedure verified Lesion destroyed using liquid nitrogen: Yes   Region frozen until ice ball extended  beyond lesion: Yes   Outcome: patient tolerated procedure well with no complications   Post-procedure details: wound care instructions given    OTHER VIRAL WARTS Lateral to right lat canthus Destruction of lesion - Lateral to right lat canthus Complexity: simple   Destruction method: cryotherapy   Informed consent: discussed and consent obtained   Timeout:  patient name, date of birth, surgical site, and procedure verified Lesion destroyed using liquid nitrogen: Yes   Region frozen until ice ball extended beyond lesion: Yes   Outcome: patient tolerated procedure well with no complications   Post-procedure details: wound care instructions given     Return in about 7 months (around 10/04/2024) for Lentigines follow up.  I, Roseline Hutchinson, CMA, am acting as scribe for [employer], DO .   Documentation: I have reviewed the above documentation for accuracy and completeness, and I agree with the above.  Delon Lenis, DO

## 2024-03-06 NOTE — Patient Instructions (Addendum)
 VISIT SUMMARY:  Today, we reviewed your skin treatment regimen and discussed the progress of your dark spots, which are improving. We also addressed a raised brown patch and a small skin tag on your face.  YOUR PLAN:  -FACIAL MELASMA:  Facial melasma is a condition that causes dark, discolored patches on your skin. Your current treatment is showing improvement. Continue using Eucerin Radiant Tone morning and night until November. In November, resume using hydroquinone  8% with kojic acid at night for four months, and continue using Eucerin Radiant Tone in the morning. In May, increase the tretinoin  concentration to 0.1%.  -FACIAL FILIFORM WART:  A filiform wart is a small, peg-like growth on the skin caused by a virus. We will freeze the wart with liquid nitrogen to help it crust and fall off. Apply Aquaphor to the treated area for the next few days.  -FACIAL SEBORRHEIC KERATOSES:  Seborrheic keratoses are non-cancerous skin growths that can appear as small, rough bumps. We will lightly freeze these areas to induce crusting and eventual fall off. Apply Aquaphor to the treated areas for the next few days.  INSTRUCTIONS:  Please follow up in four months to review the progress of your treatment and make any necessary adjustments.    Cryotherapy Aftercare  Wash gently with soap and water everyday.   Apply Vaseline and Band-Aid daily until healed.    Important Information  Due to recent changes in healthcare laws, you may see results of your pathology and/or laboratory studies on MyChart before the doctors have had a chance to review them. We understand that in some cases there may be results that are confusing or concerning to you. Please understand that not all results are received at the same time and often the doctors may need to interpret multiple results in order to provide you with the best plan of care or course of treatment. Therefore, we ask that you please give us  2 business days to  thoroughly review all your results before contacting the office for clarification. Should we see a critical lab result, you will be contacted sooner.   If You Need Anything After Your Visit  If you have any questions or concerns for your doctor, please call our main line at [phone] If no one answers, please leave a voicemail as directed and we will return your call as soon as possible. Messages left after 4 pm will be answered the following business day.   You may also send us  a message via MyChart. We typically respond to MyChart messages within 1-2 business days.  For prescription refills, please ask your pharmacy to contact our office. Our fax number is [phone].  If you have an urgent issue when the clinic is closed that cannot wait until the next business day, you can page your doctor at the number below.    Please note that while we do our best to be available for urgent issues outside of office hours, we are not available 24/7.   If you have an urgent issue and are unable to reach us , you may choose to seek medical care at your doctor's office, retail clinic, urgent care center, or emergency room.  If you have a medical emergency, please immediately call 911 or go to the emergency department. In the event of inclement weather, please call our main line at [phone] for an update on the status of any delays or closures.  Dermatology Medication Tips: Please keep the boxes that topical medications come in in order  to help keep track of the instructions about where and how to use these. Pharmacies typically print the medication instructions only on the boxes and not directly on the medication tubes.   If your medication is too expensive, please contact our office at [phone] or send us  a message through MyChart.   We are unable to tell what your co-pay for medications will be in advance as this is different depending on your insurance coverage. However, we may be able to  find a substitute medication at lower cost or fill out paperwork to get insurance to cover a needed medication.   If a prior authorization is required to get your medication covered by your insurance company, please allow us  1-2 business days to complete this process.  Drug prices often vary depending on where the prescription is filled and some pharmacies may offer cheaper prices.  The website www.goodrx.com contains coupons for medications through different pharmacies. The prices here do not account for what the cost may be with help from insurance (it may be cheaper with your insurance), but the website can give you the price if you did not use any insurance.  - You can print the associated coupon and take it with your prescription to the pharmacy.  - You may also stop by our office during regular business hours and pick up a GoodRx coupon card.  - If you need your prescription sent electronically to a different pharmacy, notify our office through [facility] or by phone at [phone]

## 2024-03-26 ENCOUNTER — Other Ambulatory Visit: Payer: Self-pay | Admitting: Family

## 2024-06-13 ENCOUNTER — Ambulatory Visit

## 2024-06-14 ENCOUNTER — Ambulatory Visit: Payer: Medicare Other | Admitting: Family

## 2024-06-14 ENCOUNTER — Encounter: Payer: Self-pay | Admitting: Family

## 2024-06-14 VITALS — BP 136/77 | HR 68 | Temp 97.7°F | Resp 16 | Ht 65.5 in | Wt 151.0 lb

## 2024-06-14 DIAGNOSIS — M810 Age-related osteoporosis without current pathological fracture: Secondary | ICD-10-CM | POA: Diagnosis not present

## 2024-06-14 DIAGNOSIS — R944 Abnormal results of kidney function studies: Secondary | ICD-10-CM | POA: Diagnosis not present

## 2024-06-14 DIAGNOSIS — F32A Depression, unspecified: Secondary | ICD-10-CM

## 2024-06-14 DIAGNOSIS — R32 Unspecified urinary incontinence: Secondary | ICD-10-CM

## 2024-06-14 DIAGNOSIS — Z1159 Encounter for screening for other viral diseases: Secondary | ICD-10-CM

## 2024-06-14 DIAGNOSIS — Z0000 Encounter for general adult medical examination without abnormal findings: Secondary | ICD-10-CM

## 2024-06-14 DIAGNOSIS — Z13220 Encounter for screening for lipoid disorders: Secondary | ICD-10-CM

## 2024-06-14 DIAGNOSIS — Z1231 Encounter for screening mammogram for malignant neoplasm of breast: Secondary | ICD-10-CM

## 2024-06-14 LAB — COMPREHENSIVE METABOLIC PANEL WITH GFR
ALT: 19 U/L (ref 3–35)
AST: 24 U/L (ref 5–37)
Albumin: 4.4 g/dL (ref 3.5–5.2)
Alkaline Phosphatase: 44 U/L (ref 39–117)
BUN: 21 mg/dL (ref 6–23)
CO2: 26 meq/L (ref 19–32)
Calcium: 10.1 mg/dL (ref 8.4–10.5)
Chloride: 108 meq/L (ref 96–112)
Creatinine, Ser: 0.93 mg/dL (ref 0.40–1.20)
GFR: 61.48 mL/min
Glucose, Bld: 88 mg/dL (ref 70–99)
Potassium: 5.5 meq/L — ABNORMAL HIGH (ref 3.5–5.1)
Sodium: 142 meq/L (ref 135–145)
Total Bilirubin: 0.6 mg/dL (ref 0.2–1.2)
Total Protein: 7.1 g/dL (ref 6.0–8.3)

## 2024-06-14 LAB — LIPID PANEL
Cholesterol: 244 mg/dL — ABNORMAL HIGH (ref 28–200)
HDL: 45.9 mg/dL
LDL Cholesterol: 176 mg/dL — ABNORMAL HIGH (ref 10–99)
NonHDL: 197.88
Total CHOL/HDL Ratio: 5
Triglycerides: 111 mg/dL (ref 10.0–149.0)
VLDL: 22.2 mg/dL (ref 0.0–40.0)

## 2024-06-14 MED ORDER — OXYBUTYNIN CHLORIDE ER 5 MG PO TB24: 5.0000 mg | ORAL_TABLET | Freq: Every day | ORAL | 1 refills | Status: AC

## 2024-06-14 NOTE — Assessment & Plan Note (Signed)
" °  Recurrent urgency and incontinence now improved and managed back on oxybutynin . Continue same. - Sent refill for oxybutynin .  "

## 2024-06-14 NOTE — Assessment & Plan Note (Signed)
 Mood stable off of SSRI. Monitor.

## 2024-06-14 NOTE — Patient Instructions (Signed)
" °  VISIT SUMMARY: During your visit, we conducted your annual physical examination and discussed your current health status. We addressed your urinary incontinence, osteoporosis, and reviewed your general health maintenance needs.  YOUR PLAN: -URINARY INCONTINENCE: Urinary incontinence is the loss of bladder control, leading to accidental urine leakage. You have been managing this with oxybutynin , which you will continue to take as it provides relief. A refill for oxybutynin  has been sent to your pharmacy.  -OSTEOPOROSIS: Osteoporosis is a condition where bones become weak and brittle. Your bone density is stable, and you should continue taking vitamin D and calcium supplements. We will repeat your bone density scan in two years to monitor your condition.  -DECREASED GLOMERULAR FILTRATION RATE: A decreased glomerular filtration rate (GFR) indicates reduced kidney function. Your previous low GFR was likely due to dehydration. We will reassess your kidney function with a repeated metabolic panel.  -GENERAL HEALTH MAINTENANCE: We discussed several preventive care measures. You need to schedule a mammogram and an eye exam. We have ordered a mammogram, hepatitis C screening, and cholesterol screening for you.  INSTRUCTIONS: Please schedule your mammogram and eye exam as soon as possible. Follow up with the lab for your hepatitis C and cholesterol screenings. Continue taking your medications and supplements as directed. We will repeat your bone density scan in two years and reassess your kidney function with the repeated metabolic panel.                                    "

## 2024-06-14 NOTE — Progress Notes (Signed)
 "  Subjective:     Patient ID: Michelle Villegas, female    DOB: 1951-05-29, 74 y.o.   MRN: [id number]  Chief Complaint  Patient presents with   Annual Exam    HPI  Discussed the use of AI scribe software for clinical note transcription with the patient, who gave verbal consent to proceed.  History of Present Illness Michelle Villegas is a 74 year old female who presents for a routine annual physical examination and requests a refill on her oxybutynin .  She has been using oxybutynin  regularly with good bladder control. Last month, she left her prescription at home during a family vacation and did not experience any symptoms, leading her to believe the issue might have resolved. However, approximately two weeks ago, her symptoms of urgency and incontinence returned, particularly when she could not reach a bathroom immediately. These symptoms are not associated with sneezing or coughing. She resumed taking her old oxybutynin  prescription and is experiencing good relief.  She has not had an eye exam in three years and plans to schedule one soon, noting that her glasses are 'on their last leg.' She also mentioned needing a mammogram but cannot recall the last time she had one. She recently saw a dermatologist for age spot removal and reported a positive response to the treatment. She wears hearing aids but did not have them in during the visit.  No current cough, cold symptoms, digestive concerns, or unusual muscle or joint pain, only experiencing 'usual old age hurts.' No frequent headaches, depression, or anxiety.  She is a retired teacher, early years/pre and is involved in caring for her grandchildren, including watching her granddaughter and grandson, with her granddaughter being in kindergarten and visiting on weekends.      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   H/O: cesarean section 1984   Heart murmur    History of tonsillectomy    Osteoporosis    Tubal pregnancy 1984    Past Surgical History:  Procedure Laterality Date   CESAREAN SECTION     X 2   ECTOPIC PREGNANCY SURGERY     KIDNEY DONATION Left 2001   TONSILLECTOMY      Family History  Problem Relation Age of Onset   Hyperlipidemia Mother    Depression Mother    Breast cancer Mother        in her58's   Cancer Mother    Cancer - Cervical Mother        angiosarcoma   COPD Father    Diabetes Brother    Hearing loss Brother    Kidney disease Brother    Diabetes Maternal Grandmother    Arthritis Maternal Grandfather    Diabetes Paternal Grandmother    Colon cancer Neg Hx    Colon polyps Neg Hx    Esophageal cancer Neg Hx    Rectal cancer Neg Hx    Stomach cancer Neg Hx     Social History   Socioeconomic History   Marital status: Married    Spouse name: Not on file   Number of children: Not on file   Years of education: Not on file   Highest education level: Not on file  Occupational History  Not on file  Tobacco Use   Smoking status: Never   Smokeless tobacco: Not on file  Vaping Use   Vaping status: Never Used  Substance and Sexual Activity   Alcohol use: No   Drug use: No   Sexual activity: Yes    Partners: Male  Other Topics Concern   Not on file  Social History Narrative   Retired teacher, early years/pre    2 grown sons, one in [place] and one in [place], granddaughter in [place]   Enjoys [place], gym, visiting family, and beach house   Has a Yorkie   Social Drivers of Health   Tobacco Use: Unknown (03/06/2024)   Patient History    Smoking Tobacco Use: Never    Smokeless Tobacco Use: Unknown    Passive Exposure: Not on file  Financial Resource Strain: Low Risk (06/08/2023)   Overall Financial Resource Strain (CARDIA)    Difficulty of Paying  Living Expenses: Not hard at all  Food Insecurity: No Food Insecurity (06/08/2023)   Hunger Vital Sign    Worried About Running Out of Food in the Last Year: Never true    Ran Out of Food in the Last Year: Never true  Transportation Needs: No Transportation Needs (06/08/2023)   PRAPARE - Administrator, Civil Service (Medical): No    Lack of Transportation (Non-Medical): No  Physical Activity: Sufficiently Active (06/08/2023)   Exercise Vital Sign    Days of Exercise per Week: 3 days    Minutes of Exercise per Session: 120 min  Stress: No Stress Concern Present (06/08/2023)   [employer] of Occupational Health - Occupational Stress Questionnaire    Feeling of Stress : Not at all  Social Connections: Socially Integrated (06/08/2023)   Social Connection and Isolation Panel    Frequency of Communication with Friends and Family: More than three times a week    Frequency of Social Gatherings with Friends and Family: More than three times a week    Attends Religious Services: More than 4 times per year    Active Member of [employer] or Organizations: Yes    Attends Banker Meetings: More than 4 times per year    Marital Status: Married  Catering Manager Violence: Not At Risk (06/08/2023)   Humiliation, Afraid, Rape, and Kick questionnaire    Fear of Current or Ex-Partner: No    Emotionally Abused: No    Physically Abused: No    Sexually Abused: No  Depression (PHQ2-9): Low Risk (06/14/2024)   Depression (PHQ2-9)    PHQ-2 Score: 0  Alcohol Screen: Low Risk (06/08/2023)   Alcohol Screen    Last Alcohol Screening Score (AUDIT): 0  Housing: Unknown (06/08/2023)   Housing Stability Vital Sign    Unable to Pay for Housing in the Last Year: No    Number of Times Moved in the Last Year: Not on file    Homeless in the Last Year: No  Utilities: Not At Risk (06/08/2023)   AHC Utilities    Threatened with loss of utilities: No  Health Literacy: Adequate Health Literacy (06/08/2023)    B1300 Health Literacy    Frequency of need for help with medical instructions: Never    Outpatient Medications Prior to Visit  Medication Sig Dispense Refill   alendronate  (FOSAMAX ) 70 MG tablet TAKE 1 TABLET (70 MG TOTAL) BY MOUTH EVERY 7 DAYS WITH FULL GLASS WATER ON EMPTY STOMACH 12 tablet 4   Beta Carotene (VITAMIN A) 25000 UNIT capsule Take 25,000  Units by mouth daily.     Biotin 5000 MCG TABS Take by mouth.     Calcium Carbonate-Vit D-Min (CALTRATE 600+D PLUS MINERALS) 600-800 MG-UNIT TABS Take 1 tablet by mouth 2 (two) times daily.     folic acid (FOLVITE) 800 MCG tablet Take by mouth daily.     hydroquinone  4 % cream Apply to the face at night on Tu, Th, Sat. Stop in 66mo. 28.35 g 2   magnesium gluconate (MAGONATE) 500 MG tablet Take 500 mg by mouth 2 (two) times daily.     Safety Seal Miscellaneous MISC Medication name: Melaxemic Cream with Tranexamic Acid 5% Kojic Acid USP 2% Vit C USP 2.5% Tretinoin  USP 0.025% Hyaluronic Acid EXCP 0.1% 1 Application 1   Safety Seal Miscellaneous MISC Apply 1 application  topically at bedtime. Medication Name: Hy-Glow hydroquinone  8% 15 g 2   tretinoin  (RETIN-A ) 0.05 % cream apply to the face 3 nights a week on MWF 45 g 2   Turmeric (QC TUMERIC COMPLEX) 500 MG CAPS Take by mouth.     vitamin C (ASCORBIC ACID) 500 MG tablet Take 500 mg by mouth daily.     zinc gluconate 50 MG tablet Take 50 mg by mouth daily.     No facility-administered medications prior to visit.    Allergies[1]  Review of Systems  Constitutional:  Negative for weight loss.  HENT:  Positive for hearing loss (wears hearing aids). Negative for congestion.   Eyes:  Negative for blurred vision.  Respiratory:  Negative for cough.   Cardiovascular:  Negative for leg swelling.  Gastrointestinal:  Negative for constipation and diarrhea.  Genitourinary:  Positive for frequency and urgency.       Improved with oxybutynin   Musculoskeletal:  Negative for joint pain and myalgias.   Skin:  Negative for rash.  Neurological:  Negative for headaches.  Psychiatric/Behavioral:  Negative for depression. The patient is not nervous/anxious.        Objective:    Physical Exam   BP 136/77 (BP Location: Right Arm, Patient Position: Sitting, Cuff Size: Normal)   Pulse 68   Temp 97.7 F (36.5 C) (Oral)   Resp 16   Ht 5' 5.5 (1.664 m)   Wt 151 lb (68.5 kg)   SpO2 100%   BMI 24.75 kg/m  Wt Readings from Last 3 Encounters:  06/14/24 151 lb (68.5 kg)  06/11/23 148 lb (67.1 kg)  06/08/23 153 lb (69.4 kg)   Physical Exam  Constitutional: She is oriented to person, place, and time. She appears well-developed and well-nourished. No distress.  HENT:  Head: Normocephalic and atraumatic.  Right Ear: Tympanic membrane and ear canal normal.  Left Ear: Tympanic membrane and ear canal normal.  Mouth/Throat: Oropharynx is clear and moist.  Eyes: Pupils are equal, round, and reactive to light. No scleral icterus.  Neck: Normal range of motion. No thyromegaly present.  Cardiovascular: Normal rate and regular rhythm.   No murmur heard. Pulmonary/Chest: Effort normal and breath sounds normal. No respiratory distress. He has no wheezes. She has no rales. She exhibits no tenderness.  Abdominal: Soft. Bowel sounds are normal. She exhibits no distension and no mass. There is no tenderness. There is no rebound and no guarding.  Musculoskeletal: She exhibits no edema.  Lymphadenopathy:    She has no cervical adenopathy.  Neurological: She is alert and oriented to person, place, and time. She has normal patellar reflexes. She exhibits normal muscle tone. Coordination normal.  Skin: Skin is warm  and dry.  Psychiatric: She has a normal mood and affect. Her behavior is normal. Judgment and thought content normal.       Assessment & Plan:       Assessment & Plan:   Problem List Items Addressed This Visit       Unprioritized   Urinary incontinence    Recurrent urgency and  incontinence now improved and managed back on oxybutynin . Continue same. - Sent refill for oxybutynin .       Relevant Medications   oxybutynin  (DITROPAN -XL) 5 MG 24 hr tablet   Preventative health care    Preventive care measures discussed. Mammogram, eye exam, hepatitis C, and cholesterol screening due. - Ordered mammogram. - Encouraged scheduling of eye exam. - Ordered hepatitis C screening. - Ordered cholesterol screening.      Osteoporosis   Continues fosamax /caltrate. Dexa up to date.      Other Visit Diagnoses       Breast cancer screening by mammogram    -  Primary   Relevant Orders   MM 3D SCREENING MAMMOGRAM BILATERAL BREAST     Encounter for hepatitis C screening test for low risk patient       Relevant Orders   Hepatitis C Antibody     Screening for lipoid disorders       Relevant Orders   Lipid panel     Decreased GFR       Relevant Orders   Comp Met (CMET)       Assessment & Plan      I have changed Sharda Aune's oxybutynin . I am also having her maintain her folic acid, vitamin A, zinc gluconate, Biotin, magnesium gluconate, ascorbic acid, Turmeric, Caltrate 600+D Plus Minerals, Safety Seal Miscellaneous, tretinoin , hydroquinone , Safety Seal Miscellaneous, and alendronate .  Meds ordered this encounter  Medications   oxybutynin  (DITROPAN -XL) 5 MG 24 hr tablet    Sig: Take 1 tablet (5 mg total) by mouth at bedtime.    Dispense:  90 tablet    Refill:  1       [1]  Allergies Allergen Reactions   Penicillins     Unsure of reaction. Childhood allergy.   "

## 2024-06-14 NOTE — Progress Notes (Signed)
 "  Established Patient Office Visit  Subjective   Patient ID: Michelle Villegas, female    DOB: 08/02/1950  Age: 74 y.o. MRN: [id number]  HPI In office today for a follow up appointment.  Patient reports she was on oxybutynin  with good symptom relief, but went on a family trip and left her prescription at home. On vacation, she had no issues and thought that the urinary issues may have resolved. However, symptoms returned about 2 weeks ago. She reports urgency and if she is not able to void immediately she experiences incontinence. This incontinence is not related to coughing or sneezing. Patient has been using her old prescription of oxybutnin, but is almost out. She desires a refil. Patient denies any other complaits she would like addressed at this visit.   Review of Systems  Constitutional:  Negative for chills and fever.  HENT:  Positive for hearing loss. Negative for ear discharge and ear pain.        Hearing loss is managed well by hearing aids  Eyes:  Negative for discharge and redness.  Respiratory:  Negative for cough.   Cardiovascular:  Negative for chest pain and leg swelling.  Gastrointestinal:  Negative for abdominal pain, constipation, nausea and vomiting.  Genitourinary:  Positive for urgency.  Musculoskeletal:  Negative for back pain, joint pain and neck pain.  Neurological:  Negative for weakness and headaches.  Psychiatric/Behavioral:  Negative for depression.      Objective:     BP 136/77 (BP Location: Right Arm, Patient Position: Sitting, Cuff Size: Normal)   Pulse 68   Temp 97.7 F (36.5 C) (Oral)   Resp 16   Ht 5' 5.5 (1.664 m)   Wt 151 lb (68.5 kg)   SpO2 100%   BMI 24.75 kg/m  BP Readings from Last 3 Encounters:  06/14/24 136/77  03/06/24 101/69  10/20/23 104/68   Physical Exam Vitals reviewed.  Constitutional:      General: She is not in acute distress.    Appearance: Normal appearance. She is normal weight. She is not ill-appearing,  toxic-appearing or diaphoretic.  HENT:     Head: Normocephalic.     Nose: Nose normal. No congestion or rhinorrhea.     Mouth/Throat:     Mouth: Mucous membranes are moist.     Pharynx: No oropharyngeal exudate or posterior oropharyngeal erythema.  Eyes:     General:        Right eye: No discharge.        Left eye: No discharge.     Extraocular Movements: Extraocular movements intact.     Conjunctiva/sclera: Conjunctivae normal.     Pupils: Pupils are equal, round, and reactive to light.  Cardiovascular:     Rate and Rhythm: Normal rate and regular rhythm.     Heart sounds: Normal heart sounds. No murmur heard.    No friction rub. No gallop.  Pulmonary:     Effort: Pulmonary effort is normal. No respiratory distress.     Breath sounds: Normal breath sounds. No stridor. No wheezing, rhonchi or rales.  Chest:     Chest wall: No tenderness.  Abdominal:     General: Abdomen is flat. Bowel sounds are normal. There is no distension.     Palpations: Abdomen is soft. There is no mass.     Tenderness: There is no abdominal tenderness. There is no guarding or rebound.     Hernia: No hernia is present.  Musculoskeletal:  General: No swelling, tenderness, deformity or signs of injury. Normal range of motion.     Cervical back: Normal range of motion and neck supple. No rigidity or tenderness.     Right lower leg: No edema.     Left lower leg: No edema.  Lymphadenopathy:     Cervical: No cervical adenopathy.  Skin:    General: Skin is warm.  Neurological:     General: No focal deficit present.     Mental Status: She is alert and oriented to person, place, and time.     Sensory: No sensory deficit.     Motor: No weakness.     Coordination: Coordination normal.     Gait: Gait normal.  Psychiatric:        Mood and Affect: Mood normal.    Last CBC Lab Results  Component Value Date   WBC 4.9 11/19/2021   HGB 13.8 11/19/2021   HCT 41.8 11/19/2021   MCV 91.9 11/19/2021   MCH  31.3 10/27/2013   RDW 14.6 11/19/2021   PLT 282.0 11/19/2021   Last metabolic panel Lab Results  Component Value Date   GLUCOSE 86 11/19/2021   NA 140 11/19/2021   K 4.6 11/19/2021   CL 105 11/19/2021   CO2 27 11/19/2021   BUN 17 11/19/2021   CREATININE 1.03 11/19/2021   GFR 55.38 (L) 11/19/2021   CALCIUM 10.1 11/19/2021   PROT 7.2 11/19/2021   ALBUMIN 4.3 11/19/2021   BILITOT 0.7 11/19/2021   ALKPHOS 51 11/19/2021   AST 29 11/19/2021   ALT 27 11/19/2021   Last thyroid  functions Lab Results  Component Value Date   TSH 1.77 11/19/2021      Assessment & Plan:  Urinary Incontinence- Refill Oxybutynin  prescription  Bone density scan- Discussed most recent result of bone density scan via mychart. Patient acknowledges receipt. Patient reports compliance with Calcium and Vitamin D supplementation recommendation.   Routine labs: CBC CMET TSH Lipid panel   Referral for mammogram -Patient is unable to remember date of last mammogram, but states she knows it needs to be done and would like a referral   Eye Exam- It has been three years since patients last eye exam discussed importance of getting this completed. Patient amenable and plans to schedule   Problem List Items Addressed This Visit       Other   Urinary incontinence   Relevant Medications   oxybutynin  (DITROPAN -XL) 5 MG 24 hr tablet   Other Visit Diagnoses       Breast cancer screening by mammogram    -  Primary   Relevant Orders   MM 3D SCREENING MAMMOGRAM BILATERAL BREAST     Encounter for hepatitis C screening test for low risk patient       Relevant Orders   Hepatitis C Antibody     Screening for lipoid disorders       Relevant Orders   Lipid panel     Decreased GFR       Relevant Orders   Comp Met (CMET)      Follow up in 1 year, unless needed to be seen sooner for symptoms worsening or not improving.  Levon Denilson Salminen-FNP student   "

## 2024-06-14 NOTE — Assessment & Plan Note (Signed)
" °  Preventive care measures discussed. Mammogram, eye exam, hepatitis C, and cholesterol screening due. - Ordered mammogram. - Encouraged scheduling of eye exam. - Ordered hepatitis C screening. - Ordered cholesterol screening. "

## 2024-06-14 NOTE — Assessment & Plan Note (Signed)
 Continues fosamax /caltrate. Dexa up to date.

## 2024-06-15 LAB — HEPATITIS C ANTIBODY: Hepatitis C Ab: NONREACTIVE

## 2024-06-16 ENCOUNTER — Ambulatory Visit: Payer: Self-pay | Admitting: Family

## 2024-06-16 DIAGNOSIS — E785 Hyperlipidemia, unspecified: Secondary | ICD-10-CM

## 2024-07-11 ENCOUNTER — Inpatient Hospital Stay (HOSPITAL_BASED_OUTPATIENT_CLINIC_OR_DEPARTMENT_OTHER): Admission: RE | Admit: 2024-07-11 | Source: Ambulatory Visit

## 2024-10-02 ENCOUNTER — Ambulatory Visit: Admitting: Dermatology

## 2024-12-12 ENCOUNTER — Other Ambulatory Visit

## 2024-12-12 ENCOUNTER — Ambulatory Visit: Admitting: Family
